# Patient Record
Sex: Female | Born: 1940 | Race: White | Hispanic: No | Smoking: Former smoker
Health system: Southern US, Community
[De-identification: ages and names within clinical notes are randomized; demographics above are authoritative.]

## PROBLEM LIST (undated history)

## (undated) DIAGNOSIS — I10 Essential (primary) hypertension: Secondary | ICD-10-CM

## (undated) DIAGNOSIS — K565 Intestinal adhesions [bands], unspecified as to partial versus complete obstruction: Secondary | ICD-10-CM

## (undated) HISTORY — PX: BOWEL RESECTION: SHX1257

## (undated) HISTORY — DX: Intestinal adhesions (bands), unspecified as to partial versus complete obstruction: K56.50

## (undated) HISTORY — PX: NASAL SEPTUM SURGERY: SHX37

## (undated) HISTORY — DX: Essential (primary) hypertension: I10

---

## 1967-07-23 HISTORY — PX: TUBAL LIGATION: SHX77

## 2014-09-09 DIAGNOSIS — Z1231 Encounter for screening mammogram for malignant neoplasm of breast: Secondary | ICD-10-CM | POA: Diagnosis not present

## 2014-10-03 DIAGNOSIS — N189 Chronic kidney disease, unspecified: Secondary | ICD-10-CM | POA: Diagnosis not present

## 2014-10-03 DIAGNOSIS — H9190 Unspecified hearing loss, unspecified ear: Secondary | ICD-10-CM | POA: Diagnosis not present

## 2014-10-03 DIAGNOSIS — R4189 Other symptoms and signs involving cognitive functions and awareness: Secondary | ICD-10-CM | POA: Diagnosis not present

## 2014-10-03 DIAGNOSIS — L309 Dermatitis, unspecified: Secondary | ICD-10-CM | POA: Diagnosis not present

## 2014-10-03 DIAGNOSIS — Z23 Encounter for immunization: Secondary | ICD-10-CM | POA: Diagnosis not present

## 2014-10-03 DIAGNOSIS — J302 Other seasonal allergic rhinitis: Secondary | ICD-10-CM | POA: Diagnosis not present

## 2014-10-03 DIAGNOSIS — M8580 Other specified disorders of bone density and structure, unspecified site: Secondary | ICD-10-CM | POA: Diagnosis not present

## 2014-10-03 DIAGNOSIS — I10 Essential (primary) hypertension: Secondary | ICD-10-CM | POA: Diagnosis not present

## 2014-10-03 DIAGNOSIS — Z139 Encounter for screening, unspecified: Secondary | ICD-10-CM | POA: Diagnosis not present

## 2015-01-28 DIAGNOSIS — J309 Allergic rhinitis, unspecified: Secondary | ICD-10-CM | POA: Diagnosis not present

## 2015-01-28 DIAGNOSIS — K5790 Diverticulosis of intestine, part unspecified, without perforation or abscess without bleeding: Secondary | ICD-10-CM | POA: Diagnosis not present

## 2015-01-28 DIAGNOSIS — K565 Intestinal adhesions [bands] with obstruction (postprocedural) (postinfection): Secondary | ICD-10-CM | POA: Diagnosis not present

## 2015-01-28 DIAGNOSIS — Z91040 Latex allergy status: Secondary | ICD-10-CM | POA: Diagnosis not present

## 2015-01-28 DIAGNOSIS — Z808 Family history of malignant neoplasm of other organs or systems: Secondary | ICD-10-CM | POA: Diagnosis not present

## 2015-01-28 DIAGNOSIS — K5669 Other intestinal obstruction: Secondary | ICD-10-CM | POA: Diagnosis not present

## 2015-01-28 DIAGNOSIS — H9190 Unspecified hearing loss, unspecified ear: Secondary | ICD-10-CM | POA: Diagnosis not present

## 2015-01-28 DIAGNOSIS — Z888 Allergy status to other drugs, medicaments and biological substances status: Secondary | ICD-10-CM | POA: Diagnosis not present

## 2015-01-28 DIAGNOSIS — K5660 Unspecified intestinal obstruction: Secondary | ICD-10-CM | POA: Diagnosis not present

## 2015-01-28 DIAGNOSIS — M8580 Other specified disorders of bone density and structure, unspecified site: Secondary | ICD-10-CM | POA: Diagnosis not present

## 2015-01-28 DIAGNOSIS — E860 Dehydration: Secondary | ICD-10-CM | POA: Diagnosis not present

## 2015-01-28 DIAGNOSIS — Z4682 Encounter for fitting and adjustment of non-vascular catheter: Secondary | ICD-10-CM | POA: Diagnosis not present

## 2015-01-28 DIAGNOSIS — F419 Anxiety disorder, unspecified: Secondary | ICD-10-CM | POA: Diagnosis not present

## 2015-01-28 DIAGNOSIS — Z885 Allergy status to narcotic agent status: Secondary | ICD-10-CM | POA: Diagnosis not present

## 2015-01-28 DIAGNOSIS — Z8249 Family history of ischemic heart disease and other diseases of the circulatory system: Secondary | ICD-10-CM | POA: Diagnosis not present

## 2015-01-28 DIAGNOSIS — I10 Essential (primary) hypertension: Secondary | ICD-10-CM | POA: Diagnosis not present

## 2015-01-28 DIAGNOSIS — Z87891 Personal history of nicotine dependence: Secondary | ICD-10-CM | POA: Diagnosis not present

## 2015-01-28 DIAGNOSIS — N181 Chronic kidney disease, stage 1: Secondary | ICD-10-CM | POA: Diagnosis not present

## 2015-01-28 DIAGNOSIS — Z8041 Family history of malignant neoplasm of ovary: Secondary | ICD-10-CM | POA: Diagnosis not present

## 2015-01-28 DIAGNOSIS — E872 Acidosis: Secondary | ICD-10-CM | POA: Diagnosis not present

## 2015-01-28 DIAGNOSIS — K660 Peritoneal adhesions (postprocedural) (postinfection): Secondary | ICD-10-CM | POA: Diagnosis not present

## 2015-01-28 DIAGNOSIS — G4700 Insomnia, unspecified: Secondary | ICD-10-CM | POA: Diagnosis not present

## 2015-01-28 DIAGNOSIS — I129 Hypertensive chronic kidney disease with stage 1 through stage 4 chronic kidney disease, or unspecified chronic kidney disease: Secondary | ICD-10-CM | POA: Diagnosis not present

## 2015-01-28 DIAGNOSIS — F329 Major depressive disorder, single episode, unspecified: Secondary | ICD-10-CM | POA: Diagnosis not present

## 2015-02-05 DIAGNOSIS — N181 Chronic kidney disease, stage 1: Secondary | ICD-10-CM | POA: Diagnosis not present

## 2015-02-05 DIAGNOSIS — I129 Hypertensive chronic kidney disease with stage 1 through stage 4 chronic kidney disease, or unspecified chronic kidney disease: Secondary | ICD-10-CM | POA: Diagnosis not present

## 2015-02-05 DIAGNOSIS — K5669 Other intestinal obstruction: Secondary | ICD-10-CM | POA: Diagnosis not present

## 2015-02-05 DIAGNOSIS — K565 Intestinal adhesions [bands] with obstruction (postprocedural) (postinfection): Secondary | ICD-10-CM | POA: Diagnosis not present

## 2015-02-08 DIAGNOSIS — N181 Chronic kidney disease, stage 1: Secondary | ICD-10-CM | POA: Diagnosis not present

## 2015-02-08 DIAGNOSIS — K5669 Other intestinal obstruction: Secondary | ICD-10-CM | POA: Diagnosis not present

## 2015-02-08 DIAGNOSIS — I129 Hypertensive chronic kidney disease with stage 1 through stage 4 chronic kidney disease, or unspecified chronic kidney disease: Secondary | ICD-10-CM | POA: Diagnosis not present

## 2015-02-08 DIAGNOSIS — K565 Intestinal adhesions [bands] with obstruction (postprocedural) (postinfection): Secondary | ICD-10-CM | POA: Diagnosis not present

## 2015-02-10 DIAGNOSIS — K565 Intestinal adhesions [bands] with obstruction (postprocedural) (postinfection): Secondary | ICD-10-CM | POA: Diagnosis not present

## 2015-02-10 DIAGNOSIS — N181 Chronic kidney disease, stage 1: Secondary | ICD-10-CM | POA: Diagnosis not present

## 2015-02-10 DIAGNOSIS — I129 Hypertensive chronic kidney disease with stage 1 through stage 4 chronic kidney disease, or unspecified chronic kidney disease: Secondary | ICD-10-CM | POA: Diagnosis not present

## 2015-02-10 DIAGNOSIS — K5669 Other intestinal obstruction: Secondary | ICD-10-CM | POA: Diagnosis not present

## 2015-02-14 DIAGNOSIS — K565 Intestinal adhesions [bands] with obstruction (postprocedural) (postinfection): Secondary | ICD-10-CM | POA: Diagnosis not present

## 2015-02-14 DIAGNOSIS — I129 Hypertensive chronic kidney disease with stage 1 through stage 4 chronic kidney disease, or unspecified chronic kidney disease: Secondary | ICD-10-CM | POA: Diagnosis not present

## 2015-02-14 DIAGNOSIS — N181 Chronic kidney disease, stage 1: Secondary | ICD-10-CM | POA: Diagnosis not present

## 2015-02-14 DIAGNOSIS — K5669 Other intestinal obstruction: Secondary | ICD-10-CM | POA: Diagnosis not present

## 2015-02-21 DIAGNOSIS — I129 Hypertensive chronic kidney disease with stage 1 through stage 4 chronic kidney disease, or unspecified chronic kidney disease: Secondary | ICD-10-CM | POA: Diagnosis not present

## 2015-02-21 DIAGNOSIS — N181 Chronic kidney disease, stage 1: Secondary | ICD-10-CM | POA: Diagnosis not present

## 2015-02-21 DIAGNOSIS — K5669 Other intestinal obstruction: Secondary | ICD-10-CM | POA: Diagnosis not present

## 2015-02-21 DIAGNOSIS — K565 Intestinal adhesions [bands] with obstruction (postprocedural) (postinfection): Secondary | ICD-10-CM | POA: Diagnosis not present

## 2015-04-01 DIAGNOSIS — K122 Cellulitis and abscess of mouth: Secondary | ICD-10-CM | POA: Diagnosis not present

## 2015-04-01 DIAGNOSIS — Z8719 Personal history of other diseases of the digestive system: Secondary | ICD-10-CM | POA: Diagnosis not present

## 2015-04-01 DIAGNOSIS — R634 Abnormal weight loss: Secondary | ICD-10-CM | POA: Diagnosis not present

## 2015-04-01 DIAGNOSIS — I10 Essential (primary) hypertension: Secondary | ICD-10-CM | POA: Diagnosis not present

## 2015-04-01 DIAGNOSIS — K565 Intestinal adhesions [bands] with obstruction (postprocedural) (postinfection): Secondary | ICD-10-CM | POA: Diagnosis not present

## 2015-04-28 DIAGNOSIS — L0390 Cellulitis, unspecified: Secondary | ICD-10-CM | POA: Diagnosis not present

## 2015-04-28 DIAGNOSIS — Z719 Counseling, unspecified: Secondary | ICD-10-CM | POA: Diagnosis not present

## 2015-06-09 DIAGNOSIS — K122 Cellulitis and abscess of mouth: Secondary | ICD-10-CM | POA: Diagnosis not present

## 2015-06-09 DIAGNOSIS — D485 Neoplasm of uncertain behavior of skin: Secondary | ICD-10-CM | POA: Diagnosis not present

## 2015-06-09 DIAGNOSIS — Z719 Counseling, unspecified: Secondary | ICD-10-CM | POA: Diagnosis not present

## 2015-12-10 DIAGNOSIS — R109 Unspecified abdominal pain: Secondary | ICD-10-CM | POA: Diagnosis not present

## 2015-12-10 DIAGNOSIS — K5660 Unspecified intestinal obstruction: Secondary | ICD-10-CM | POA: Diagnosis not present

## 2015-12-10 DIAGNOSIS — N189 Chronic kidney disease, unspecified: Secondary | ICD-10-CM | POA: Diagnosis not present

## 2015-12-10 DIAGNOSIS — Z8249 Family history of ischemic heart disease and other diseases of the circulatory system: Secondary | ICD-10-CM | POA: Diagnosis not present

## 2015-12-10 DIAGNOSIS — Z808 Family history of malignant neoplasm of other organs or systems: Secondary | ICD-10-CM | POA: Diagnosis not present

## 2015-12-10 DIAGNOSIS — I129 Hypertensive chronic kidney disease with stage 1 through stage 4 chronic kidney disease, or unspecified chronic kidney disease: Secondary | ICD-10-CM | POA: Diagnosis not present

## 2015-12-10 DIAGNOSIS — R51 Headache: Secondary | ICD-10-CM | POA: Diagnosis not present

## 2015-12-10 DIAGNOSIS — M8580 Other specified disorders of bone density and structure, unspecified site: Secondary | ICD-10-CM | POA: Diagnosis not present

## 2016-04-06 DIAGNOSIS — S3991XA Unspecified injury of abdomen, initial encounter: Secondary | ICD-10-CM | POA: Diagnosis not present

## 2016-04-06 DIAGNOSIS — S20211A Contusion of right front wall of thorax, initial encounter: Secondary | ICD-10-CM | POA: Diagnosis not present

## 2016-04-06 DIAGNOSIS — R918 Other nonspecific abnormal finding of lung field: Secondary | ICD-10-CM | POA: Diagnosis not present

## 2016-04-06 DIAGNOSIS — S0990XA Unspecified injury of head, initial encounter: Secondary | ICD-10-CM | POA: Diagnosis not present

## 2016-04-06 DIAGNOSIS — S0101XA Laceration without foreign body of scalp, initial encounter: Secondary | ICD-10-CM | POA: Diagnosis not present

## 2016-04-06 DIAGNOSIS — F419 Anxiety disorder, unspecified: Secondary | ICD-10-CM | POA: Diagnosis not present

## 2016-04-06 DIAGNOSIS — S90512A Abrasion, left ankle, initial encounter: Secondary | ICD-10-CM | POA: Diagnosis not present

## 2016-04-06 DIAGNOSIS — S99912A Unspecified injury of left ankle, initial encounter: Secondary | ICD-10-CM | POA: Diagnosis not present

## 2016-04-06 DIAGNOSIS — I10 Essential (primary) hypertension: Secondary | ICD-10-CM | POA: Diagnosis not present

## 2016-04-06 DIAGNOSIS — S3993XA Unspecified injury of pelvis, initial encounter: Secondary | ICD-10-CM | POA: Diagnosis not present

## 2016-08-23 ENCOUNTER — Encounter: Payer: Self-pay | Admitting: Internal Medicine

## 2016-08-23 ENCOUNTER — Ambulatory Visit (INDEPENDENT_AMBULATORY_CARE_PROVIDER_SITE_OTHER): Payer: Medicare Other | Admitting: Internal Medicine

## 2016-08-23 VITALS — BP 152/86 | HR 63 | Temp 98.2°F | Resp 16 | Ht 62.5 in | Wt 126.0 lb

## 2016-08-23 DIAGNOSIS — E559 Vitamin D deficiency, unspecified: Secondary | ICD-10-CM

## 2016-08-23 DIAGNOSIS — R5383 Other fatigue: Secondary | ICD-10-CM

## 2016-08-23 DIAGNOSIS — Z23 Encounter for immunization: Secondary | ICD-10-CM | POA: Diagnosis not present

## 2016-08-23 DIAGNOSIS — K5909 Other constipation: Secondary | ICD-10-CM

## 2016-08-23 DIAGNOSIS — F411 Generalized anxiety disorder: Secondary | ICD-10-CM

## 2016-08-23 DIAGNOSIS — I10 Essential (primary) hypertension: Secondary | ICD-10-CM

## 2016-08-23 LAB — COMPREHENSIVE METABOLIC PANEL
ALBUMIN: 4.4 g/dL (ref 3.6–5.1)
ALT: 13 U/L (ref 6–29)
AST: 19 U/L (ref 10–35)
Alkaline Phosphatase: 67 U/L (ref 33–130)
BUN: 19 mg/dL (ref 7–25)
CALCIUM: 9.4 mg/dL (ref 8.6–10.4)
CHLORIDE: 106 mmol/L (ref 98–110)
CO2: 24 mmol/L (ref 20–31)
Creat: 1.09 mg/dL — ABNORMAL HIGH (ref 0.60–0.93)
GLUCOSE: 91 mg/dL (ref 65–99)
Potassium: 5.3 mmol/L (ref 3.5–5.3)
Sodium: 138 mmol/L (ref 135–146)
Total Bilirubin: 0.5 mg/dL (ref 0.2–1.2)
Total Protein: 7.4 g/dL (ref 6.1–8.1)

## 2016-08-23 LAB — CBC WITH DIFFERENTIAL/PLATELET
BASOS PCT: 0 %
Basophils Absolute: 0 cells/uL (ref 0–200)
EOS PCT: 0 %
Eosinophils Absolute: 0 cells/uL — ABNORMAL LOW (ref 15–500)
HEMATOCRIT: 37.6 % (ref 35.0–45.0)
HEMOGLOBIN: 12.4 g/dL (ref 11.7–15.5)
LYMPHS ABS: 2236 {cells}/uL (ref 850–3900)
Lymphocytes Relative: 26 %
MCH: 30 pg (ref 27.0–33.0)
MCHC: 33 g/dL (ref 32.0–36.0)
MCV: 91 fL (ref 80.0–100.0)
MONO ABS: 688 {cells}/uL (ref 200–950)
MPV: 9.4 fL (ref 7.5–12.5)
Monocytes Relative: 8 %
NEUTROS ABS: 5676 {cells}/uL (ref 1500–7800)
NEUTROS PCT: 66 %
Platelets: 362 10*3/uL (ref 140–400)
RBC: 4.13 MIL/uL (ref 3.80–5.10)
RDW: 13.9 % (ref 11.0–15.0)
WBC: 8.6 10*3/uL (ref 3.8–10.8)

## 2016-08-23 LAB — TSH: TSH: 1.92 m[IU]/L

## 2016-08-23 MED ORDER — LISINOPRIL-HYDROCHLOROTHIAZIDE 20-12.5 MG PO TABS: 1.0000 | ORAL_TABLET | Freq: Every day | ORAL | 0 refills | Status: DC

## 2016-08-23 MED ORDER — PAROXETINE HCL 20 MG PO TABS: 20.0000 mg | ORAL_TABLET | Freq: Every day | ORAL | 0 refills | Status: DC

## 2016-08-23 NOTE — Progress Notes (Signed)
Pre visit review using our clinic review tool, if applicable. No additional management support is needed unless otherwise documented below in the visit note. 

## 2016-08-23 NOTE — Patient Instructions (Addendum)
I will refill the lisinopril for 90 days  At  The current dose ,  But if your home readings are < 130/80,  Increase the dose to  2 tablets mg daily   I am changing your anxiety pill to Paxil because I think it will help you be more calm and worry less .  You can start the paxil tomorrow   You can take your fiber in a pill using Metamucil or Fibercon.  Take every night  At bedtime   Taking a probiotic every day may help control your diarrhea  .Marland Kitchen Taking an antibiotic can create an imbalance in the normal population of bacteria that live in the small intestine.  This imbalance can persist for 3 months.    The the chocolate covered  on by Schiff

## 2016-08-23 NOTE — Progress Notes (Signed)
Subjective:  Patient ID: Felicia Anderson, female    DOB: 04/21/1941  Age: 76 y.o. MRN: [id number]  CC: The primary encounter diagnosis was Vitamin D deficiency. Diagnoses of Need for influenza vaccination, Fatigue, unspecified type, Essential hypertension, Chronic constipation, and Generalized anxiety disorder were also pertinent to this visit.  HPI Felicia Anderson presents for establishment of care, referred by daughter Felicia Anderson. Patient recently relocated to [facility] from [place] and medical records are not available.   Cc: episodic diarrhea,  Alternating with constipation and diarrhea, ,  History of bowel obstructions x 3 requiring surgery   Was told to take metamucil daily  But does not remember to take it on a regular basis   History of hypertension:  Has been taking  lisinopril 5 mg daily for years.   Has been having more frequent headaches ,  Has not had her annual eye exam.   Anxiety: present for many years,  Has been taking citalopram for years  For mild depression but anxiety has been pervasive .  No prior trials of or=ther ssri's.  No outpatient prescriptions prior to visit.   No facility-administered medications prior to visit.     Review of Systems;  Patient denies headache, fevers, malaise, unintentional weight loss, skin rash, eye pain, sinus congestion and sinus pain, sore throat, dysphagia,  hemoptysis , cough, dyspnea, wheezing, chest pain, palpitations, orthopnea, edema, abdominal pain, nausea, melena, diarrhea, flank pain, dysuria, hematuria, urinary  Frequency, nocturia, numbness, tingling, seizures,  Focal weakness, Loss of consciousness,  Tremor, insomnia, depression, anxiety, and suicidal ideation.      Objective:  BP (!) 152/86   Pulse 63   Temp 98.2 F (36.8 C) (Oral)   Resp 16   Ht 5' 2.5" (1.588 m)   Wt 126 lb (57.2 kg)   SpO2 97%   BMI 22.68 kg/m   BP Readings from Last 3 Encounters:  08/23/16 (!) 152/86    Wt Readings from Last 3  Encounters:  08/23/16 126 lb (57.2 kg)    General appearance: alert, cooperative and appears stated age Ears: normal TM's and external ear canals both ears Throat: lips, mucosa, and tongue normal; teeth and gums normal Neck: no adenopathy, no carotid bruit, supple, symmetrical, trachea midline and thyroid not enlarged, symmetric, no tenderness/mass/nodules Back: symmetric, no curvature. ROM normal. No CVA tenderness. Lungs: clear to auscultation bilaterally Heart: regular rate and rhythm, S1, S2 normal, no murmur, click, rub or gallop Abdomen: soft, non-tender; bowel sounds normal; no masses,  no organomegaly Pulses: 2+ and symmetric Skin: Skin color, texture, turgor normal. No rashes or lesions Lymph nodes: Cervical, supraclavicular, and axillary nodes normal.  No results found for: HGBA1C  Lab Results  Component Value Date   CREATININE 1.09 (H) 08/23/2016    Lab Results  Component Value Date   WBC 8.6 08/23/2016   HGB 12.4 08/23/2016   HCT 37.6 08/23/2016   PLT 362 08/23/2016   GLUCOSE 91 08/23/2016   ALT 13 08/23/2016   AST 19 08/23/2016   NA 138 08/23/2016   K 5.3 08/23/2016   CL 106 08/23/2016   CREATININE 1.09 (H) 08/23/2016   BUN 19 08/23/2016   CO2 24 08/23/2016   TSH 1.92 08/23/2016    Patient was never admitted.  Assessment & Plan:   Problem List Items Addressed This Visit    Chronic constipation    With history of recurrent SBO requiring surgeries.  Advise to start using citrucel/miralax daily. Records requested  Generalized anxiety disorder    Her anxeit is not well controlled with citalopram  Dicussed change to paxil       Hypertension    Managed with lisinopril for years.  Checking lytes and cr today, both of which are abnormal .  Will repeat both in one week  and  If still elevated change to  amlodipine   Lab Results  Component Value Date   CREATININE 1.09 (H) 08/23/2016   Lab Results  Component Value Date   NA 138 08/23/2016   K 5.3  08/23/2016   CL 106 08/23/2016   CO2 24 08/23/2016   No results found for: MICROALBUR, MALB24HUR       Relevant Medications   lisinopril-hydrochlorothiazide (PRINZIDE,ZESTORETIC) 20-12.5 MG tablet    Other Visit Diagnoses    Vitamin D deficiency    -  Primary   Relevant Orders   VITAMIN D 25 Hydroxy (Vit-D Deficiency, Fractures) (Completed)   Need for influenza vaccination       Relevant Orders   Flu vaccine HIGH DOSE PF (Completed)   Fatigue, unspecified type       Relevant Orders   Comprehensive metabolic panel (Completed)   TSH (Completed)   CBC with Differential/Platelet (Completed)      I am having [name] start on PARoxetine. I am also having her maintain her citalopram, Melatonin, multivitamin, (VITAMIN D, CHOLECALCIFEROL, PO), acetaminophen, and lisinopril-hydrochlorothiazide.  Meds ordered this encounter  Medications  . citalopram (CELEXA) 20 MG tablet    Sig: Take 20 mg by mouth daily.  Marland Kitchen DISCONTD: lisinopril-hydrochlorothiazide (PRINZIDE,ZESTORETIC) 20-12.5 MG tablet    Sig: Take 1 tablet by mouth daily.  . Melatonin 5 MG TABS    Sig: Take 1 tablet by mouth at bedtime.  . Multiple Vitamin (MULTIVITAMIN) tablet    Sig: Take 1 tablet by mouth daily.  Marland Kitchen VITAMIN D, CHOLECALCIFEROL, PO    Sig: Take 1 each by mouth daily.  Marland Kitchen acetaminophen (TYLENOL) 500 MG tablet    Sig: Take 500 mg by mouth every 6 (six) hours as needed.  Marland Kitchen lisinopril-hydrochlorothiazide (PRINZIDE,ZESTORETIC) 20-12.5 MG tablet    Sig: Take 1 tablet by mouth daily.    Dispense:  90 tablet    Refill:  0  . PARoxetine (PAXIL) 20 MG tablet    Sig: Take 1 tablet (20 mg total) by mouth daily.    Dispense:  90 tablet    Refill:  0    Medications Discontinued During This Encounter  Medication Reason  . lisinopril-hydrochlorothiazide (PRINZIDE,ZESTORETIC) 20-12.5 MG tablet Reorder    Follow-up: Return in about 4 weeks (around 09/20/2016) for bp follow up .   Crecencio Mc, MD

## 2016-08-24 LAB — VITAMIN D 25 HYDROXY (VIT D DEFICIENCY, FRACTURES): Vit D, 25-Hydroxy: 58 ng/mL (ref 30–100)

## 2016-08-25 ENCOUNTER — Encounter: Payer: Self-pay | Admitting: Internal Medicine

## 2016-08-25 DIAGNOSIS — K5909 Other constipation: Secondary | ICD-10-CM | POA: Insufficient documentation

## 2016-08-25 DIAGNOSIS — F411 Generalized anxiety disorder: Secondary | ICD-10-CM | POA: Insufficient documentation

## 2016-08-25 DIAGNOSIS — I10 Essential (primary) hypertension: Secondary | ICD-10-CM | POA: Insufficient documentation

## 2016-08-25 NOTE — Assessment & Plan Note (Signed)
Managed with lisinopril for years.  Checking lytes and cr today, both of which are abnormal .  Will repeat both in one week  and  If still elevated change to  amlodipine   Lab Results  Component Value Date   CREATININE 1.09 (H) 08/23/2016   Lab Results  Component Value Date   NA 138 08/23/2016   K 5.3 08/23/2016   CL 106 08/23/2016   CO2 24 08/23/2016   No results found for: Derl Barrow

## 2016-08-25 NOTE — Assessment & Plan Note (Signed)
Her anxeit is not well controlled with citalopram  Dicussed change to paxil

## 2016-08-25 NOTE — Assessment & Plan Note (Addendum)
With history of recurrent SBO requiring surgeries.  Advise to start using citrucel/miralax daily. Records requested

## 2016-08-27 ENCOUNTER — Telehealth: Payer: Self-pay | Admitting: Internal Medicine

## 2016-08-27 DIAGNOSIS — E875 Hyperkalemia: Secondary | ICD-10-CM

## 2016-08-27 NOTE — Telephone Encounter (Signed)
Pt daughter [name] called back with the information that you requested. Pt has not been on any other blood pressure medication other than lisinopril-hydrochlorothiazide (PRINZIDE,ZESTORETIC) 20-12.5 MG tablet she has been taking it for about 4 years. She has not been told by any other doctor that her Potassium was midly level was elevated or that her Kidney function was slightly off. Please advise, thank you!  Call pt @ [phone]

## 2016-08-27 NOTE — Telephone Encounter (Signed)
The below message is based on result note below. Notes Recorded by Cresenciano Lick, CMA on 08/26/2016 at 4:02 PM EST Pt's daughter informed. She is going to double check with pt and call us back.  Notes Recorded by Crecencio Mc, MD on 08/25/2016 at 5:21 PM EST Your potassium is mildly elevated and your kidney function is slightly off. Have you ever been told this before? It may be due to the lisinopril. Have you ever been any other blood pressure medication?

## 2016-08-28 NOTE — Telephone Encounter (Signed)
While we are waiting for records  We should repeat the BMET in 1 week

## 2016-08-28 NOTE — Telephone Encounter (Signed)
Left detailed mess informing pt's daughter of PCP's advisement.

## 2016-09-05 ENCOUNTER — Other Ambulatory Visit (INDEPENDENT_AMBULATORY_CARE_PROVIDER_SITE_OTHER): Payer: Medicare Other

## 2016-09-05 DIAGNOSIS — E875 Hyperkalemia: Secondary | ICD-10-CM | POA: Diagnosis not present

## 2016-09-06 LAB — BASIC METABOLIC PANEL
BUN: 24 mg/dL — AB (ref 6–23)
CHLORIDE: 104 meq/L (ref 96–112)
CO2: 25 meq/L (ref 19–32)
Calcium: 9.3 mg/dL (ref 8.4–10.5)
Creatinine, Ser: 1.25 mg/dL — ABNORMAL HIGH (ref 0.40–1.20)
GFR: 44.5 mL/min — ABNORMAL LOW (ref >60.00)
Glucose, Bld: 107 mg/dL — ABNORMAL HIGH (ref 70–99)
POTASSIUM: 4.7 meq/L (ref 3.5–5.1)
Sodium: 138 mEq/L (ref 135–145)

## 2016-09-08 ENCOUNTER — Encounter: Payer: Self-pay | Admitting: Internal Medicine

## 2016-09-08 ENCOUNTER — Other Ambulatory Visit: Payer: Self-pay | Admitting: Internal Medicine

## 2016-09-08 DIAGNOSIS — N183 Chronic kidney disease, stage 3 unspecified: Secondary | ICD-10-CM | POA: Insufficient documentation

## 2016-09-08 MED ORDER — AMLODIPINE BESYLATE 5 MG PO TABS
5.0000 mg | ORAL_TABLET | Freq: Every day | ORAL | 0 refills | Status: DC
Start: 2016-09-08 — End: 2016-10-08

## 2016-09-16 DIAGNOSIS — S0083XA Contusion of other part of head, initial encounter: Secondary | ICD-10-CM | POA: Diagnosis not present

## 2016-09-16 DIAGNOSIS — W19XXXA Unspecified fall, initial encounter: Secondary | ICD-10-CM | POA: Diagnosis not present

## 2016-09-16 DIAGNOSIS — Z719 Counseling, unspecified: Secondary | ICD-10-CM | POA: Diagnosis not present

## 2016-09-24 ENCOUNTER — Telehealth: Payer: Self-pay | Admitting: *Deleted

## 2016-09-24 NOTE — Telephone Encounter (Signed)
App made for pt

## 2016-09-24 NOTE — Telephone Encounter (Signed)
Pt's daughter called to schedule fasting labs. Will this appt require fasting ?

## 2016-10-01 ENCOUNTER — Other Ambulatory Visit: Payer: Medicare Other

## 2016-10-08 ENCOUNTER — Other Ambulatory Visit: Payer: Self-pay | Admitting: Internal Medicine

## 2016-10-08 ENCOUNTER — Other Ambulatory Visit (INDEPENDENT_AMBULATORY_CARE_PROVIDER_SITE_OTHER): Payer: Medicare Other

## 2016-10-08 DIAGNOSIS — N183 Chronic kidney disease, stage 3 unspecified: Secondary | ICD-10-CM

## 2016-10-08 LAB — BASIC METABOLIC PANEL
BUN: 14 mg/dL (ref 6–23)
CALCIUM: 9.5 mg/dL (ref 8.4–10.5)
CHLORIDE: 104 meq/L (ref 96–112)
CO2: 28 meq/L (ref 19–32)
CREATININE: 0.89 mg/dL (ref 0.40–1.20)
GFR: 65.84 mL/min (ref >60.00)
Glucose, Bld: 89 mg/dL (ref 70–99)
Potassium: 4.2 mEq/L (ref 3.5–5.1)
Sodium: 139 mEq/L (ref 135–145)

## 2016-10-09 ENCOUNTER — Other Ambulatory Visit: Payer: Self-pay | Admitting: Internal Medicine

## 2016-10-09 DIAGNOSIS — I10 Essential (primary) hypertension: Secondary | ICD-10-CM

## 2016-10-09 DIAGNOSIS — N183 Chronic kidney disease, stage 3 unspecified: Secondary | ICD-10-CM

## 2016-10-09 DIAGNOSIS — E7800 Pure hypercholesterolemia, unspecified: Secondary | ICD-10-CM

## 2016-10-09 MED ORDER — AMLODIPINE BESYLATE 5 MG PO TABS: 5.0000 mg | ORAL_TABLET | Freq: Every day | ORAL | 0 refills | Status: DC

## 2016-10-09 NOTE — Progress Notes (Unsigned)
ipid

## 2016-11-19 ENCOUNTER — Other Ambulatory Visit: Payer: Self-pay | Admitting: Internal Medicine

## 2017-01-10 ENCOUNTER — Encounter: Payer: Self-pay | Admitting: Internal Medicine

## 2017-01-10 ENCOUNTER — Ambulatory Visit (INDEPENDENT_AMBULATORY_CARE_PROVIDER_SITE_OTHER): Payer: Medicare Other | Admitting: Internal Medicine

## 2017-01-10 VITALS — BP 132/70 | HR 63 | Temp 97.8°F | Resp 17 | Ht 62.5 in | Wt 137.2 lb

## 2017-01-10 DIAGNOSIS — F411 Generalized anxiety disorder: Secondary | ICD-10-CM

## 2017-01-10 DIAGNOSIS — E7800 Pure hypercholesterolemia, unspecified: Secondary | ICD-10-CM | POA: Diagnosis not present

## 2017-01-10 DIAGNOSIS — Z720 Tobacco use: Secondary | ICD-10-CM

## 2017-01-10 DIAGNOSIS — N183 Chronic kidney disease, stage 3 unspecified: Secondary | ICD-10-CM

## 2017-01-10 DIAGNOSIS — R41840 Attention and concentration deficit: Secondary | ICD-10-CM | POA: Diagnosis not present

## 2017-01-10 DIAGNOSIS — K5909 Other constipation: Secondary | ICD-10-CM | POA: Diagnosis not present

## 2017-01-10 DIAGNOSIS — E538 Deficiency of other specified B group vitamins: Secondary | ICD-10-CM

## 2017-01-10 DIAGNOSIS — I10 Essential (primary) hypertension: Secondary | ICD-10-CM | POA: Diagnosis not present

## 2017-01-10 LAB — LIPID PANEL
CHOL/HDL RATIO: 2
Cholesterol: 134 mg/dL (ref 0–200)
HDL: 54.1 mg/dL (ref >39.00)
LDL CALC: 63 mg/dL (ref 0–99)
NonHDL: 79.97
TRIGLYCERIDES: 87 mg/dL (ref 0.0–149.0)
VLDL: 17.4 mg/dL (ref 0.0–40.0)

## 2017-01-10 LAB — COMPREHENSIVE METABOLIC PANEL
ALT: 13 U/L (ref 0–35)
AST: 18 U/L (ref 0–37)
Albumin: 4.2 g/dL (ref 3.5–5.2)
Alkaline Phosphatase: 68 U/L (ref 39–117)
BILIRUBIN TOTAL: 0.6 mg/dL (ref 0.2–1.2)
BUN: 20 mg/dL (ref 6–23)
CHLORIDE: 105 meq/L (ref 96–112)
CO2: 27 meq/L (ref 19–32)
CREATININE: 1.01 mg/dL (ref 0.40–1.20)
Calcium: 9.3 mg/dL (ref 8.4–10.5)
GFR: 56.86 mL/min — ABNORMAL LOW (ref >60.00)
GLUCOSE: 87 mg/dL (ref 70–99)
Potassium: 4.5 mEq/L (ref 3.5–5.1)
SODIUM: 141 meq/L (ref 135–145)
Total Protein: 6.9 g/dL (ref 6.0–8.3)

## 2017-01-10 LAB — VITAMIN B12: Vitamin B-12: 608 pg/mL (ref 211–911)

## 2017-01-10 NOTE — Patient Instructions (Addendum)
For your constipation and diarrhea issues:  Continue taking probiotic and generic citrucel every night. . Add 100 to  200 mg colace (stool softener) suspend colace only,  For loose stools.  For your anxiety:  We will change your antidepressant from paxil to generic zoloft, starting witt 50 mg daily .  Your blood pressure is much better on the amlodipine   For your concentration issues:  I am referring you to Dr Lurline Hare for memory testing

## 2017-01-10 NOTE — Progress Notes (Signed)
Subjective:  Patient ID: Felicia Anderson, female    DOB: February 25, 1941  Age: 76 y.o. MRN: [id number]  CC: The primary encounter diagnosis was Cognitive attention deficit. Diagnoses of B12 deficiency, Pure hypercholesterolemia, Essential hypertension, Concentration deficit, Chronic constipation, Generalized anxiety disorder, and CKD (chronic kidney disease) stage 3, GFR 30-59 ml/min were also pertinent to this visit.  HPI Felicia Anderson presents for follow up on hypertension and GAD   Medication changes at  Her previous/initial visit: citaloprram  Was changed to  paxil  2) Hypertension :  At her last visit medication Lisinopril changed to to amlodipine   3) chronic constipation:  She has been taking probiotic and fiber supplement for the  last several months.  Still having periods of constipation lasting  4 to 5 days, not taking a stool softener  4) GAD worse: "worries about everything" , daughter says she obsesses,  And feels she has ADHD because she does not have good concentration and doesn't remember details.  Patient is Upset about giving up her house and independence in [place] and moving in with Felicia Anderson her daughter for the past 8 months .  The original plan was to live with all 3 children in a revolving schedule.but that hasn't happened and she feels like she is a burden to her children . Feels her daughter Felicia Anderson gets frustrated with her .  Feels isolated, has no friends. Not inclined to seek out freiends,  Introverted.     Takes B12 supplements   Discussed referral to Felicia Anderson   bp better  Outpatient Medications Prior to Visit  Medication Sig Dispense Refill  . acetaminophen (TYLENOL) 500 MG tablet Take 500 mg by mouth every 6 (six) hours as needed.    Marland Kitchen amLODipine (NORVASC) 5 MG tablet Take 1 tablet (5 mg total) by mouth daily. 90 tablet 0  . Melatonin 5 MG TABS Take 1 tablet by mouth at bedtime.    . Multiple Vitamin (MULTIVITAMIN) tablet Take 1 tablet by mouth daily.    Marland Kitchen PARoxetine  (PAXIL) 20 MG tablet TAKE 1 TABLET (20 MG TOTAL) BY MOUTH DAILY. 90 tablet 0  . VITAMIN D, CHOLECALCIFEROL, PO Take 1 each by mouth daily.    . citalopram (CELEXA) 20 MG tablet Take 20 mg by mouth daily.    Marland Kitchen lisinopril-hydrochlorothiazide (PRINZIDE,ZESTORETIC) 20-12.5 MG tablet TAKE 1 TABLET BY MOUTH DAILY. (Patient not taking: Reported on 01/10/2017) 90 tablet 0   No facility-administered medications prior to visit.     Review of Systems;  Patient denies headache, fevers, malaise, unintentional weight loss, skin rash, eye pain, sinus congestion and sinus pain, sore throat, dysphagia,  hemoptysis , cough, dyspnea, wheezing, chest pain, palpitations, orthopnea, edema, abdominal pain, nausea, melena, diarrhea, constipation, flank pain, dysuria, hematuria, urinary  Frequency, nocturia, numbness, tingling, seizures,  Focal weakness, Loss of consciousness,  Tremor, insomnia, depression, anxiety, and suicidal ideation.      Objective:  BP 132/70 (BP Location: Left Arm, Patient Position: Sitting, Cuff Size: Normal)   Pulse 63   Temp 97.8 F (36.6 C) (Oral)   Resp 17   Ht 5' 2.5" (1.588 m)   Wt 137 lb 3.2 oz (62.2 kg)   SpO2 99%   BMI 24.69 kg/m   BP Readings from Last 3 Encounters:  01/10/17 132/70  08/23/16 (!) 152/86    Wt Readings from Last 3 Encounters:  01/10/17 137 lb 3.2 oz (62.2 kg)  08/23/16 126 lb (57.2 kg)    General appearance: alert, cooperative and  appears stated age Ears: normal TM's and external ear canals both ears Throat: lips, mucosa, and tongue normal; teeth and gums normal Neck: no adenopathy, no carotid bruit, supple, symmetrical, trachea midline and thyroid not enlarged, symmetric, no tenderness/mass/nodules Back: symmetric, no curvature. ROM normal. No CVA tenderness. Lungs: clear to auscultation bilaterally Heart: regular rate and rhythm, S1, S2 normal, no murmur, click, rub or gallop Abdomen: soft, non-tender; bowel sounds normal; no masses,  no  organomegaly Pulses: 2+ and symmetric Skin: Skin color, texture, turgor normal. No rashes or lesions Lymph nodes: Cervical, supraclavicular, and axillary nodes normal.  No results found for: HGBA1C  Lab Results  Component Value Date   CREATININE 1.01 01/10/2017   CREATININE 0.89 10/08/2016   CREATININE 1.25 (H) 09/05/2016    Lab Results  Component Value Date   WBC 8.6 08/23/2016   HGB 12.4 08/23/2016   HCT 37.6 08/23/2016   PLT 362 08/23/2016   GLUCOSE 87 01/10/2017   CHOL 134 01/10/2017   TRIG 87.0 01/10/2017   HDL 54.10 01/10/2017   LDLCALC 63 01/10/2017   ALT 13 01/10/2017   AST 18 01/10/2017   NA 141 01/10/2017   K 4.5 01/10/2017   CL 105 01/10/2017   CREATININE 1.01 01/10/2017   BUN 20 01/10/2017   CO2 27 01/10/2017   TSH 1.92 08/23/2016    Patient was never admitted.  Assessment & Plan:   Problem List Items Addressed This Visit    Hypertension   Relevant Orders   Comprehensive metabolic panel (Completed)   Generalized anxiety disorder    Changing paxil to zoloft for OCD thought patterns       Concentration deficit    Unclear if her symptoms are due to untreated anxiety bordering on obsessiveness,  Or whether ADD is playimg a role. Referral to Dr Felicia Anderson for neurocognitive testing       CKD (chronic kidney disease) stage 3, GFR 30-59 ml/min    Resolved with  D/c lisinopril  Lab Results  Component Value Date   CREATININE 1.01 01/10/2017   CREATININE 0.89 10/08/2016   CREATININE 1.25 (H) 09/05/2016        Chronic constipation    Advised to take colace, citrucel and probiotic daily.       Other Visit Diagnoses    Cognitive attention deficit    -  Primary   Relevant Orders   Ambulatory referral to Psychology   B12 deficiency       Relevant Orders   Vitamin B12 (Completed)   Pure hypercholesterolemia       Relevant Orders   Lipid panel (Completed)      I have discontinued Felicia Anderson's citalopram and lisinopril-hydrochlorothiazide.  I am also having her maintain her Melatonin, multivitamin, (VITAMIN D, CHOLECALCIFEROL, PO), acetaminophen, amLODipine, and PARoxetine.  No orders of the defined types were placed in this encounter.   Medications Discontinued During This Encounter  Medication Reason  . citalopram (CELEXA) 20 MG tablet Patient has not taken in last 30 days  . lisinopril-hydrochlorothiazide (PRINZIDE,ZESTORETIC) 20-12.5 MG tablet Patient has not taken in last 30 days    Follow-up: Return in about 4 weeks (around 02/07/2017).   Crecencio Mc, MD

## 2017-01-12 DIAGNOSIS — F0280 Dementia in other diseases classified elsewhere without behavioral disturbance: Secondary | ICD-10-CM | POA: Insufficient documentation

## 2017-01-12 DIAGNOSIS — G309 Alzheimer's disease, unspecified: Secondary | ICD-10-CM

## 2017-01-12 DIAGNOSIS — F0281 Dementia in other diseases classified elsewhere with behavioral disturbance: Secondary | ICD-10-CM | POA: Insufficient documentation

## 2017-01-12 NOTE — Assessment & Plan Note (Signed)
Unclear if her symptoms are due to untreated anxiety bordering on obsessiveness,  Or whether ADD is playimg a role. Referral to Dr Rainey Pines for neurocognitive testing

## 2017-01-12 NOTE — Assessment & Plan Note (Addendum)
Resolved with  D/c lisinopril  Lab Results  Component Value Date   CREATININE 1.01 01/10/2017   CREATININE 0.89 10/08/2016   CREATININE 1.25 (H) 09/05/2016

## 2017-01-12 NOTE — Assessment & Plan Note (Signed)
Changing paxil to zoloft for OCD thought patterns

## 2017-01-12 NOTE — Assessment & Plan Note (Signed)
Advised to take colace, citrucel and probiotic daily.

## 2017-01-29 DIAGNOSIS — S0011XA Contusion of right eyelid and periocular area, initial encounter: Secondary | ICD-10-CM | POA: Diagnosis not present

## 2017-01-29 DIAGNOSIS — W01190A Fall on same level from slipping, tripping and stumbling with subsequent striking against furniture, initial encounter: Secondary | ICD-10-CM | POA: Diagnosis not present

## 2017-02-06 ENCOUNTER — Other Ambulatory Visit: Payer: Self-pay | Admitting: Internal Medicine

## 2017-02-06 DIAGNOSIS — N183 Chronic kidney disease, stage 3 unspecified: Secondary | ICD-10-CM

## 2017-02-12 ENCOUNTER — Encounter: Payer: Self-pay | Admitting: Internal Medicine

## 2017-02-12 ENCOUNTER — Ambulatory Visit (INDEPENDENT_AMBULATORY_CARE_PROVIDER_SITE_OTHER): Payer: Medicare Other | Admitting: Internal Medicine

## 2017-02-12 VITALS — BP 134/78 | HR 64 | Temp 98.0°F | Resp 15 | Ht 62.5 in | Wt 136.4 lb

## 2017-02-12 DIAGNOSIS — R41840 Attention and concentration deficit: Secondary | ICD-10-CM | POA: Diagnosis not present

## 2017-02-12 DIAGNOSIS — N183 Chronic kidney disease, stage 3 unspecified: Secondary | ICD-10-CM

## 2017-02-12 DIAGNOSIS — Z1231 Encounter for screening mammogram for malignant neoplasm of breast: Secondary | ICD-10-CM

## 2017-02-12 DIAGNOSIS — I10 Essential (primary) hypertension: Secondary | ICD-10-CM

## 2017-02-12 DIAGNOSIS — Z1239 Encounter for other screening for malignant neoplasm of breast: Secondary | ICD-10-CM

## 2017-02-12 DIAGNOSIS — F411 Generalized anxiety disorder: Secondary | ICD-10-CM

## 2017-02-12 MED ORDER — SERTRALINE HCL 100 MG PO TABS
100.0000 mg | ORAL_TABLET | Freq: Every day | ORAL | 3 refills | Status: DC
Start: 2017-02-12 — End: 2017-06-15

## 2017-02-12 NOTE — Patient Instructions (Addendum)
When you switch from paxil to zoloft  And take the zoloft  at night after dinner on a full stomach . You can start with a 1/2 tablet for the first few days,    You do not have dementia!    I have scheduled your mammogram

## 2017-02-12 NOTE — Progress Notes (Signed)
Subjective:  Patient ID: Felicia Anderson, female    DOB: March 23, 1941  Age: 76 y.o. MRN: [id number]  CC: The primary encounter diagnosis was Breast cancer screening. Diagnoses of Generalized anxiety disorder, Concentration deficit, CKD (chronic kidney disease) stage 3, GFR 30-59 ml/min, and Essential hypertension were also pertinent to this visit.  HPI Felicia Anderson presents for follow up o cognitive changes and uncontrolled anxiety   She was last seen in mid June and medication change form paxil to zoloft was advsed but she  Has not made the change.  She continues to report symptoms otms of poor concentration,  Forgetfulness, and isolation complicated by deceased hearing and a feeling that she is a burden to her children, especially her daughter Felicia Anderson, who is with her today.  She is alone all day long and  has not made any attempt to meet people her own age , so when Felicia Anderson comes home from work, she is eager for company and conversation; unfortunately, Felicia Anderson is often tired and just wants peace and quiet.   Health maintenance issues reviewed:  Last colonoscopy done in [place],  [place]. She has a history of partial  bowel resection, due to recurrent bowel obstruction  remotely   Last DEXA also done [facility]   MMSE was done today with considerable difficulty due to hearing loss      Outpatient Medications Prior to Visit  Medication Sig Dispense Refill  . acetaminophen (TYLENOL) 500 MG tablet Take 500 mg by mouth every 6 (six) hours as needed.    Marland Kitchen amLODipine (NORVASC) 5 MG tablet TAKE 1 TABLET (5 MG TOTAL) BY MOUTH DAILY. 90 tablet 1  . Melatonin 5 MG TABS Take 1 tablet by mouth at bedtime.    . Multiple Vitamin (MULTIVITAMIN) tablet Take 1 tablet by mouth daily.    Marland Kitchen PARoxetine (PAXIL) 20 MG tablet TAKE 1 TABLET (20 MG TOTAL) BY MOUTH DAILY. 90 tablet 0  . VITAMIN D, CHOLECALCIFEROL, PO Take 1 each by mouth daily.     No facility-administered medications prior to visit.      Review of Systems;  Patient denies headache, fevers, malaise, unintentional weight loss, skin rash, eye pain, sinus congestion and sinus pain, sore throat, dysphagia,  hemoptysis , cough, dyspnea, wheezing, chest pain, palpitations, orthopnea, edema, abdominal pain, nausea, melena, diarrhea, constipation, flank pain, dysuria, hematuria, urinary  Frequency, nocturia, numbness, tingling, seizures,  Focal weakness, Loss of consciousness,  Tremor, insomnia, and suicidal ideation.      Objective:  BP 134/78 (BP Location: Left Arm, Patient Position: Sitting, Cuff Size: Normal)   Pulse 64   Temp 98 F (36.7 C) (Oral)   Resp 15   Ht 5' 2.5" (1.588 m)   Wt 136 lb 6.4 oz (61.9 kg)   SpO2 99%   BMI 24.55 kg/m   BP Readings from Last 3 Encounters:  02/12/17 134/78  01/10/17 132/70  08/23/16 (!) 152/86    Wt Readings from Last 3 Encounters:  02/12/17 136 lb 6.4 oz (61.9 kg)  01/10/17 137 lb 3.2 oz (62.2 kg)  08/23/16 126 lb (57.2 kg)    General appearance: alert, anxious, hard of hearing, but cooperative and appears stated age Ears: normal TM's and external ear canals both ears Throat: lips, mucosa, and tongue normal; teeth and gums normal Neck: no adenopathy, no carotid bruit, supple, symmetrical, trachea midline and thyroid not enlarged, symmetric, no tenderness/mass/nodules Back: symmetric, no curvature. ROM normal. No CVA tenderness. Lungs: clear to auscultation bilaterally Heart: regular  rate and rhythm, S1, S2 normal, no murmur, click, rub or gallop Abdomen: soft, non-tender; bowel sounds normal; no masses,  no organomegaly Pulses: 2+ and symmetric Skin: Skin color, texture, turgor normal. No rashes or lesions Lymph nodes: Cervical, supraclavicular, and axillary nodes normal. MMSE: 27/30 unable to calculate serial 7s or spell world backword  No results found for: HGBA1C  Lab Results  Component Value Date   CREATININE 1.01 01/10/2017   CREATININE 0.89 10/08/2016    CREATININE 1.25 (H) 09/05/2016    Lab Results  Component Value Date   WBC 8.6 08/23/2016   HGB 12.4 08/23/2016   HCT 37.6 08/23/2016   PLT 362 08/23/2016   GLUCOSE 87 01/10/2017   CHOL 134 01/10/2017   TRIG 87.0 01/10/2017   HDL 54.10 01/10/2017   LDLCALC 63 01/10/2017   ALT 13 01/10/2017   AST 18 01/10/2017   NA 141 01/10/2017   K 4.5 01/10/2017   CL 105 01/10/2017   CREATININE 1.01 01/10/2017   BUN 20 01/10/2017   CO2 27 01/10/2017   TSH 1.92 08/23/2016    Patient was never admitted.  Assessment & Plan:   Problem List Items Addressed This Visit    CKD (chronic kidney disease) stage 3, GFR 30-59 ml/min    Resolved change in BP medication from lisinopril  To amlodipine .  No changes today .   Lab Results  Component Value Date   CREATININE 1.01 01/10/2017   CREATININE 0.89 10/08/2016   CREATININE 1.25 (H) 09/05/2016        Concentration deficit    She does not appear to have dementia, but  Her concentration is low and complicated by hearing loss.  Referral for cognitive testing is in progress to [facility]    Recommend change from paroxetine 20 mg to zoloft 100 mg.  [facility] her [facility]      Hypertension    Well controlled on current regimen of amlodipine 5 mg daily.   Lisinopril stopped with return of Cr to normal.  no changes today.       Other Visit Diagnoses    Breast cancer screening    -  Primary   Relevant Orders   MM SCREENING BREAST TOMO BILATERAL     A total of 25 minutes of face to face time was spent with patient more than half of which was spent in counselling about the above mentioned conditions  and coordination of care    I have discontinued Felicia Anderson's (VITAMIN D, CHOLECALCIFEROL, PO) and PARoxetine. I am also having her start on sertraline. Additionally, I am having her maintain her Melatonin, multivitamin, acetaminophen, amLODipine,  Vitamin D3, and B Complex Vitamins (VITAMIN B COMPLEX PO).  Meds ordered this encounter  Medications  . Cholecalciferol (VITAMIN D3) 100000 UNIT/GM POWD    Sig: Take by mouth.  . B Complex Vitamins (VITAMIN B COMPLEX PO)    Sig: Take by mouth.  . sertraline (ZOLOFT) 100 MG tablet    Sig: Take 1 tablet (100 mg total) by mouth daily.    Dispense:  30 tablet    Refill:  3    Medications Discontinued During This Encounter  Medication Reason  . VITAMIN D, CHOLECALCIFEROL, PO Patient has not taken in last 30 days  . PARoxetine (PAXIL) 20 MG tablet     Follow-up: Return in about 3 months (around 05/15/2017) for CPE,  sooner if wanted .   Crecencio Mc, MD

## 2017-02-15 ENCOUNTER — Telehealth: Payer: Self-pay | Admitting: Internal Medicine

## 2017-02-15 NOTE — Assessment & Plan Note (Signed)
She does not appear to have dementia, but  Her concentration is low and complicated by hearing loss.  Referral for cognitive testing is in progress to [facility]

## 2017-02-15 NOTE — Assessment & Plan Note (Signed)
Recommend change from paroxetine 20 mg to zoloft 100 mg.  Encouraged [facility] her [facility]

## 2017-02-15 NOTE — Assessment & Plan Note (Signed)
Well controlled on current regimen of amlodipine 5 mg daily.   Lisinopril stopped with return of Cr to normal.  no changes today.

## 2017-02-15 NOTE — Assessment & Plan Note (Signed)
Resolved change in BP medication from lisinopril  To amlodipine .  No changes today .   Lab Results  Component Value Date   CREATININE 1.01 01/10/2017   CREATININE 0.89 10/08/2016   CREATININE 1.25 (H) 09/05/2016

## 2017-03-12 ENCOUNTER — Encounter: Payer: Self-pay | Admitting: Internal Medicine

## 2017-04-04 ENCOUNTER — Encounter: Payer: Self-pay | Admitting: Emergency Medicine

## 2017-04-04 ENCOUNTER — Emergency Department
Admission: EM | Admit: 2017-04-04 | Discharge: 2017-04-04 | Disposition: A | Discharge status: Home / Self Care | Payer: Medicare Other | Attending: Emergency Medicine | Admitting: Emergency Medicine

## 2017-04-04 ENCOUNTER — Emergency Department: Payer: Medicare Other

## 2017-04-04 DIAGNOSIS — N183 Chronic kidney disease, stage 3 (moderate): Secondary | ICD-10-CM | POA: Insufficient documentation

## 2017-04-04 DIAGNOSIS — Y929 Unspecified place or not applicable: Secondary | ICD-10-CM | POA: Insufficient documentation

## 2017-04-04 DIAGNOSIS — S93601A Unspecified sprain of right foot, initial encounter: Secondary | ICD-10-CM | POA: Diagnosis not present

## 2017-04-04 DIAGNOSIS — Z87891 Personal history of nicotine dependence: Secondary | ICD-10-CM | POA: Diagnosis not present

## 2017-04-04 DIAGNOSIS — W010XXA Fall on same level from slipping, tripping and stumbling without subsequent striking against object, initial encounter: Secondary | ICD-10-CM | POA: Diagnosis not present

## 2017-04-04 DIAGNOSIS — S93401A Sprain of unspecified ligament of right ankle, initial encounter: Secondary | ICD-10-CM

## 2017-04-04 DIAGNOSIS — Y999 Unspecified external cause status: Secondary | ICD-10-CM | POA: Diagnosis not present

## 2017-04-04 DIAGNOSIS — I129 Hypertensive chronic kidney disease with stage 1 through stage 4 chronic kidney disease, or unspecified chronic kidney disease: Secondary | ICD-10-CM | POA: Diagnosis not present

## 2017-04-04 DIAGNOSIS — S99921A Unspecified injury of right foot, initial encounter: Secondary | ICD-10-CM | POA: Diagnosis present

## 2017-04-04 DIAGNOSIS — Z79899 Other long term (current) drug therapy: Secondary | ICD-10-CM | POA: Diagnosis not present

## 2017-04-04 DIAGNOSIS — Y939 Activity, unspecified: Secondary | ICD-10-CM | POA: Insufficient documentation

## 2017-04-04 DIAGNOSIS — M25571 Pain in right ankle and joints of right foot: Secondary | ICD-10-CM | POA: Diagnosis not present

## 2017-04-04 MED ORDER — PENICILLIN V POTASSIUM 500 MG PO TABS: 500.0000 mg | ORAL_TABLET | Freq: Once | ORAL | Status: DC

## 2017-04-04 MED ORDER — TRAMADOL HCL 50 MG PO TABS
50.0000 mg | ORAL_TABLET | Freq: Once | ORAL | Status: DC
  Filled 2017-04-04: qty 1

## 2017-04-04 NOTE — ED Notes (Signed)
See triage note  States she tripped about 1 1/2 weeks ago  Developed right ankle pain  conts to have pain to same ankle  Ambulates with sl limp d./t pain  Positive pulses and good sensation

## 2017-04-04 NOTE — ED Provider Notes (Signed)
[facility] Emergency Department Provider Note ____________________________________________  Time seen: 1816  I have reviewed the triage vital signs and the nursing notes.  HISTORY  Chief Complaint  Ankle Pain  HPI Felicia Anderson is a 76 y.o. female presents to the ED for evaluation of continued right ankle pain after fall initially 2 weeks ago. Patient describes sitting up something, after she rolled her ankle 2 weeks prior. She did manage his symptoms at home with Tylenol or Motrin. She reports today she inadvertently hit something on top of her foot, and had increased pain to the dorsal foot and ankle. She presents now further evaluation.  Past Medical History:  Diagnosis Date  . Hypertension   . Small bowel obstruction due to adhesions [facility])     Patient Active Problem List   Diagnosis Date Noted  . Concentration deficit 01/12/2017  . CKD (chronic kidney disease) stage 3, GFR 30-59 ml/min 09/08/2016  . Hypertension 08/25/2016  . Chronic constipation 08/25/2016  . Generalized anxiety disorder 08/25/2016    Past Surgical History:  Procedure Laterality Date  . BOWEL RESECTION    . NASAL SEPTUM SURGERY    . TUBAL LIGATION  1969    Prior to Admission medications   Medication Sig Start Date End Date Taking? Authorizing Provider  acetaminophen (TYLENOL) 500 MG tablet Take 500 mg by mouth every 6 (six) hours as needed.    [provider]  amLODipine (NORVASC) 5 MG tablet TAKE 1 TABLET (5 MG TOTAL) BY MOUTH DAILY. 02/06/17   Crecencio Mc, MD  B Complex Vitamins (VITAMIN B COMPLEX PO) Take by mouth.    [provider]  Cholecalciferol (VITAMIN D3) 100000 UNIT/GM POWD Take by mouth.    [provider]  Melatonin 5 MG TABS Take 1 tablet by mouth at bedtime.    [provider]  Multiple Vitamin (MULTIVITAMIN) tablet Take 1 tablet by mouth daily.    [provider]  sertraline (ZOLOFT) 100 MG tablet Take 1 tablet (100  mg total) by mouth daily. 02/12/17   Crecencio Mc, MD    Allergies Morphine and related  Family History  Problem Relation Age of Onset  . Cancer Mother   . Heart attack Father   . Alcohol abuse Father   . Alzheimer's disease Sister   . Cerebral aneurysm Brother   . Hypertension Daughter   . Hypertension Son   . Hypertension Daughter     Social History Social History  Substance Use Topics  . Smoking status: Former Research scientist (life sciences)  . Smokeless tobacco: Never Used  . Alcohol use No    Review of Systems  Constitutional: Negative for fever. Musculoskeletal: Negative for back pain. Right ankle pain as above. Skin: Negative for rash. Neurological: Negative for headaches, focal weakness or numbness. ____________________________________________  PHYSICAL EXAM:  VITAL SIGNS: ED Triage Vitals  Enc Vitals Group     BP 04/04/17 1725 (!) 175/98     Pulse Rate 04/04/17 1725 76     Resp 04/04/17 1725 18     Temp 04/04/17 1725 98.2 F (36.8 C)     Temp Source 04/04/17 1725 Oral     SpO2 04/04/17 1725 100 %     Weight 04/04/17 1725 128 lb (58.1 kg)     Height 04/04/17 1725 5\' 2"  (1.575 m)     Head Circumference --      Peak Flow --      Pain Score 04/04/17 1724 8     Pain  Loc --      Pain Edu? --      Excl. in [place]? --     Constitutional: Alert and oriented. Well appearing and in no distress. Head: Normocephalic and atraumatic. Cardiovascular: Normal distal pulses. Respiratory: Normal respiratory effort.  Musculoskeletal: Right foot and ankle without obvious deformity, dislocation, or effusion. No ecchymosis, bruising, erythema is appreciated. Patient is mildly tender to palpation over the dorsal aspect of the medial talus region. No medial or lateral joint pain tenderness is noted about the ankle. No calf or Achilles tenderness is noted. Normal range of motion in all planes. Negative anterior/posterior drawer. Nontender with normal range of motion in all extremities.  Neurologic:   Normal gait without ataxia. Normal speech and language. No gross focal neurologic deficits are appreciated. Skin:  Skin is warm, dry and intact. No rash noted. ____________________________________________   RADIOLOGY  Right Ankle  IMPRESSION: No acute fracture or dislocation of the right ankle.  I, Darnisha Vernet, Dannielle Karvonen, personally viewed and evaluated these images (plain radiographs) as part of my medical decision making, as well as reviewing the written report by the radiologist. ____________________________________________  PROCEDURES  Ace bandage ____________________________________________  INITIAL IMPRESSION / ASSESSMENT AND PLAN / ED COURSE  Patient was ED evaluation of right foot and ankle pain. She is found to have a negative x-ray for any acute fracture or dislocation. She is discharged with surgical management of a right foot and ankle sprain. Ace bandage is placed for support. She will follow with her primary care provider or podiatry, for ongoing symptom management. ____________________________________________  FINAL CLINICAL IMPRESSION(S) / ED DIAGNOSES  Final diagnoses:  Sprain of right ankle, unspecified ligament, initial encounter  Sprain of right foot, initial encounter      Melvenia Needles, PA-C 04/04/17 2301    Darel Hong, MD 04/04/17 2347

## 2017-04-04 NOTE — ED Triage Notes (Signed)
Patient presents to ED via POV from home. Patient had a fall 2 weeks ago, patient tripped over something. Patient reports right ankle pain. Ambulatory with limp.

## 2017-04-04 NOTE — Discharge Instructions (Addendum)
Your exam and x-ray are negative for any fracture or dislocation. Follow-up with your provider for ongoing symptoms. You may also call Dr. Vickki Muff for recheck. Take OTC Tylenol and Motrin for pain relief. Rest, ice, and elevate the foot as needed.

## 2017-04-04 NOTE — ED Notes (Signed)
Pt discharged to home.  Family member driving.  Discharge instructions reviewed.  Verbalized understanding.  No questions or concerns at this time.  Teach back verified.  Pt in NAD.  No items left in ED.   

## 2017-04-14 ENCOUNTER — Ambulatory Visit
Admission: RE | Admit: 2017-04-14 | Discharge: 2017-04-14 | Disposition: A | Discharge status: Home / Self Care | Payer: Medicare Other | Source: Ambulatory Visit | Attending: Internal Medicine | Admitting: Internal Medicine

## 2017-04-14 DIAGNOSIS — Z1231 Encounter for screening mammogram for malignant neoplasm of breast: Secondary | ICD-10-CM | POA: Insufficient documentation

## 2017-04-14 DIAGNOSIS — Z1239 Encounter for other screening for malignant neoplasm of breast: Secondary | ICD-10-CM

## 2017-04-17 NOTE — Telephone Encounter (Signed)
Error

## 2017-04-18 ENCOUNTER — Other Ambulatory Visit: Payer: Self-pay | Admitting: *Deleted

## 2017-04-18 ENCOUNTER — Inpatient Hospital Stay
Admission: RE | Admit: 2017-04-18 | Discharge: 2017-04-18 | Disposition: A | Discharge status: Home / Self Care | Payer: Self-pay | Source: Ambulatory Visit | Attending: *Deleted | Admitting: *Deleted

## 2017-04-18 DIAGNOSIS — Z9289 Personal history of other medical treatment: Secondary | ICD-10-CM

## 2017-04-21 ENCOUNTER — Ambulatory Visit (INDEPENDENT_AMBULATORY_CARE_PROVIDER_SITE_OTHER): Payer: Medicare Other | Admitting: Internal Medicine

## 2017-04-21 ENCOUNTER — Encounter: Payer: Self-pay | Admitting: Internal Medicine

## 2017-04-21 VITALS — BP 120/68 | HR 65 | Temp 98.3°F | Resp 15 | Ht 62.0 in | Wt 130.6 lb

## 2017-04-21 DIAGNOSIS — R41840 Attention and concentration deficit: Secondary | ICD-10-CM | POA: Diagnosis not present

## 2017-04-21 DIAGNOSIS — Z1231 Encounter for screening mammogram for malignant neoplasm of breast: Secondary | ICD-10-CM | POA: Diagnosis not present

## 2017-04-21 DIAGNOSIS — F411 Generalized anxiety disorder: Secondary | ICD-10-CM

## 2017-04-21 DIAGNOSIS — Z1239 Encounter for other screening for malignant neoplasm of breast: Secondary | ICD-10-CM

## 2017-04-21 DIAGNOSIS — I10 Essential (primary) hypertension: Secondary | ICD-10-CM

## 2017-04-21 DIAGNOSIS — Z1211 Encounter for screening for malignant neoplasm of colon: Secondary | ICD-10-CM | POA: Diagnosis not present

## 2017-04-21 DIAGNOSIS — R296 Repeated falls: Secondary | ICD-10-CM | POA: Diagnosis not present

## 2017-04-21 DIAGNOSIS — Z23 Encounter for immunization: Secondary | ICD-10-CM | POA: Diagnosis not present

## 2017-04-21 DIAGNOSIS — H524 Presbyopia: Secondary | ICD-10-CM | POA: Diagnosis not present

## 2017-04-21 NOTE — Progress Notes (Signed)
Patient ID: Vanassa Penniman, female    DOB: June 02, 1941  Age: 76 y.o. MRN: [id number]  The patient is here for  management of other chronic and acute problems.  Mammogram done last week    The risk factors are reflected in the social history.  The roster of all physicians providing medical care to patient - is listed in the Snapshot section of the chart.  Activities of daily living:  The patient is 100% independent in all ADLs: dressing, toileting, feeding as well as independent mobility  Home safety : The patient has smoke detectors in the home. They wear seatbelts.  There are no firearms at home. There is no violence in the home.   There is no risks for hepatitis, STDs or HIV. There is no   history of blood transfusion. They have no travel history to infectious disease endemic areas of the world.  The patient has seen their dentist in the last six month. They have seen their eye doctor in the last year. They admit to slight hearing difficulty with regard to whispered voices and some television programs.  They have deferred audiologic testing in the last year.  They do not  have excessive sun exposure. Discussed the need for sun protection: hats, long sleeves and use of sunscreen if there is significant sun exposure.   Diet: the importance of a healthy diet is discussed. They do have a healthy diet.  The benefits of regular aerobic exercise were discussed. She walks 4 times per week ,  20 minutes.   Depression screen: there are no signs or vegative symptoms of untreated depression- irritability, change in appetite, anhedonia, sadness/tearfullness.  She does continue to report anxiety which is partly reactive to situation and is tolerating the  change from paxil to zoloft 100 mg   Cognitive assessment: the patient has decreased concentration  And mild cognitive changes aggravated by her loss of hearing.  She feels this has imporved with the ouse of borrowed hearing aids. She is actively engaged.  They could relate day,date,year and events; recalled 2/3 objects at 3 minutes; performed clock-face test normally. She lives with her daughter [name]  The following portions of the patient's history were reviewed and updated as appropriate: allergies, current medications, past family history, past medical history,  past surgical history, past social history  and problem list.  Visual acuity was not assessed per patient preference since she has regular follow up with her ophthalmologist. Hearing and body mass index were assessed and reviewed.   During the course of the visit the patient was educated and counseled about appropriate screening and preventive services including : fall prevention , diabetes screening, nutrition counseling, colorectal cancer screening, and recommended immunizations.    CC: The primary encounter diagnosis was Falls frequently. Diagnoses of Encounter for immunization, Presbyopia of both eyes, Generalized anxiety disorder, Essential hypertension, Concentration deficit, Breast cancer screening, Screening for colon cancer, and Frequent falls were also pertinent to this visit.   1) Hypertension : in the setting of ER visit on Sept 14th for sprained ankle (right )  Sustained after tripping over something in the yard while wearing flips flops.  Has limited her activitiy to elevated foot.  Still some swelling  And a knot on the anterior surface of ankle .  X rays normal. Repeat bp normal.    2) Frequent falls:  Patient reports that she has had 4 falls since moving in with her daughter [name].  Only one has been atrributed to wearing flip  flops, one to tripping over "clutter" in Terry's house.  Two seem unprovoked by environment. She denies dizziness, orthostatic symptoms and leg weakness. Has not had vision checked in  over 5 years .  Feels that she falls to the right a lot but denies right leg weakness.     3) wearing  Borrowed hearing aids.    4) GAD:  Tolerating zoloft, but  " I  still get nervous" around [place].   Feels like she is a burden to her daughter,  Linus Mako on eggshells around her.   Continues to avoid making new social outlets . Going to [place] to visit family and "give [name] a break"  History Walida has a past medical history of Hypertension and Small bowel obstruction due to adhesions ([place]).   She has a past surgical history that includes Tubal ligation (1969); Nasal septum surgery; and Bowel resection.   Her family history includes Alcohol abuse in her father; Alzheimer's disease in her sister; Cancer in her mother; Cerebral aneurysm in her brother; Heart attack in her father; Hypertension in her daughter, daughter, and son.She reports that she has quit smoking. She has never used smokeless tobacco. She reports that she does not drink alcohol or use drugs.  Outpatient Medications Prior to Visit  Medication Sig Dispense Refill  . acetaminophen (TYLENOL) 500 MG tablet Take 500 mg by mouth every 6 (six) hours as needed.    Marland Kitchen amLODipine (NORVASC) 5 MG tablet TAKE 1 TABLET (5 MG TOTAL) BY MOUTH DAILY. 90 tablet 1  . B Complex Vitamins (VITAMIN B COMPLEX PO) Take by mouth.    . Cholecalciferol (VITAMIN D3) 100000 UNIT/GM POWD Take by mouth.    . Melatonin 5 MG TABS Take 1 tablet by mouth at bedtime.    . Multiple Vitamin (MULTIVITAMIN) tablet Take 1 tablet by mouth daily.    . sertraline (ZOLOFT) 100 MG tablet Take 1 tablet (100 mg total) by mouth daily. 30 tablet 3   No facility-administered medications prior to visit.     Review of Systems   Patient denies headache, fevers, malaise, unintentional weight loss, skin rash, eye pain, sinus congestion and sinus pain, sore throat, dysphagia,  hemoptysis , cough, dyspnea, wheezing, chest pain, palpitations, orthopnea, edema, abdominal pain, nausea, melena, diarrhea, constipation, flank pain, dysuria, hematuria, urinary  Frequency, nocturia, numbness, tingling, seizures,  Focal weakness, Loss of consciousness,  Tremor,  insomnia, depression,and suicidal ideation.      Objective:  BP 120/68 (BP Location: Left Arm, Patient Position: Sitting, Cuff Size: Normal)   Pulse 65   Temp 98.3 F (36.8 C) (Oral)   Resp 15   Ht 5\' 2"  (1.575 m)   Wt 130 lb 9.6 oz (59.2 kg)   SpO2 98%   BMI 23.89 kg/m   Physical Exam  General appearance: alert, cooperative and appears stated age Ears: normal TM's and external ear canals both ears Throat: lips, mucosa, and tongue normal; teeth and gums normal Neck: no adenopathy, no carotid bruit, supple, symmetrical, trachea midline and thyroid not enlarged, symmetric, no tenderness/mass/nodules Back: symmetric, no curvature. ROM normal. No CVA tenderness. Lungs: clear to auscultation bilaterally Heart: regular rate and rhythm, S1, S2 normal, no murmur, click, rub or gallop Abdomen: soft, non-tender; bowel sounds normal; no masses,  no organomegaly Pulses: 2+ and symmetric Skin: Skin color, texture, turgor normal. No rashes or lesions Lymph nodes: Cervical, supraclavicular, and axillary nodes normal. Neuro: CNs 2-12 intact. DTRs 2+/4 in biceps, brachioradialis, patellars and achilles. Muscle strength 5/5  in upper and lower exremities. Fine resting tremor bilaterally both hands cerebellar function normal. Romberg negative.  No pronator drift.   Gait normal.    Assessment & Plan:   Problem List Items Addressed This Visit    Breast cancer screening    Continue annual mammomgrams      Concentration deficit    Aggravated by anxiety and loss of hearing       Frequent falls    Referral for vision testing made  Neurologic exam is normal and she denies orthostatic symptoms and dizziness.  Will consider PT referral for evaluation of gait       Generalized anxiety disorder    Managed with zoloft . No changes today      Hypertension    Well controlled on current regimen. Renal function stable, no changes today.  Lab Results  Component Value Date   CREATININE 1.01  01/10/2017   Lab Results  Component Value Date   NA 141 01/10/2017   K 4.5 01/10/2017   CL 105 01/10/2017   CO2 27 01/10/2017         Screening for colon cancer    Other Visit Diagnoses    Falls frequently    -  Primary   Relevant Orders   Ambulatory referral to Physical Therapy   Encounter for immunization       Relevant Orders   Flu vaccine HIGH DOSE PF (Completed)   Presbyopia of both eyes       Relevant Orders   Ambulatory referral to Ophthalmology     A total of 40 minutes was spent with patient more than half of which was spent in counseling patient on the above mentioned issues , reviewing and explaining recent labs and imaging studies done, and coordination of care.  I am having Ms. [place] maintain her Melatonin, multivitamin, acetaminophen, amLODipine, Vitamin D3, B Complex Vitamins (VITAMIN B COMPLEX PO), and sertraline.  No orders of the defined types were placed in this encounter.   There are no discontinued medications.  Follow-up: Return in about 3 months (around 07/22/2017).   Crecencio Mc, MD

## 2017-04-21 NOTE — Patient Instructions (Signed)
Your neurologic exam is normal.  I see no deficit that would increase your risk for falling   I have made a referral for an EYE EXAM and for physical theapy to see if your  Strength in the right foot is contributing  DO NOT WEAR FLIP FLOPS,  CLOGS  OR HIGH HEELS  Wear sensible shoes. .   I WANT YOU TO MEET ONE NEW PERSON BY THE NEXT TIME I SEE YOU I N  3  MONTHS

## 2017-04-22 DIAGNOSIS — Z1239 Encounter for other screening for malignant neoplasm of breast: Secondary | ICD-10-CM | POA: Insufficient documentation

## 2017-04-22 DIAGNOSIS — Z1211 Encounter for screening for malignant neoplasm of colon: Secondary | ICD-10-CM | POA: Insufficient documentation

## 2017-04-22 DIAGNOSIS — R296 Repeated falls: Secondary | ICD-10-CM | POA: Insufficient documentation

## 2017-04-22 NOTE — Assessment & Plan Note (Signed)
Well controlled on current regimen. Renal function stable, no changes today.  Lab Results  Component Value Date   CREATININE 1.01 01/10/2017   Lab Results  Component Value Date   NA 141 01/10/2017   K 4.5 01/10/2017   CL 105 01/10/2017   CO2 27 01/10/2017

## 2017-04-22 NOTE — Assessment & Plan Note (Addendum)
Referral for vision testing made.  Her neurologic exam is normal and she denies orthostatic symptoms and dizziness.  Will consider PT referral for evaluation of gait

## 2017-04-22 NOTE — Assessment & Plan Note (Signed)
Managed with zoloft . No changes today

## 2017-04-22 NOTE — Assessment & Plan Note (Signed)
Continue annual mammomgrams

## 2017-04-22 NOTE — Assessment & Plan Note (Signed)
Aggravated by anxiety and loss of hearing

## 2017-05-12 ENCOUNTER — Ambulatory Visit: Payer: Medicare Other

## 2017-05-15 ENCOUNTER — Ambulatory Visit: Payer: Medicare Other

## 2017-06-15 ENCOUNTER — Other Ambulatory Visit: Payer: Self-pay | Admitting: Internal Medicine

## 2017-07-17 ENCOUNTER — Other Ambulatory Visit: Payer: Self-pay

## 2017-07-17 MED ORDER — SERTRALINE HCL 100 MG PO TABS: 100.0000 mg | ORAL_TABLET | Freq: Every day | ORAL | 0 refills | Status: DC

## 2017-07-25 ENCOUNTER — Telehealth: Payer: Self-pay | Admitting: Internal Medicine

## 2017-07-25 ENCOUNTER — Ambulatory Visit (INDEPENDENT_AMBULATORY_CARE_PROVIDER_SITE_OTHER): Payer: Medicare Other

## 2017-07-25 ENCOUNTER — Encounter: Payer: Self-pay | Admitting: Internal Medicine

## 2017-07-25 ENCOUNTER — Ambulatory Visit (INDEPENDENT_AMBULATORY_CARE_PROVIDER_SITE_OTHER): Payer: Medicare Other | Admitting: Internal Medicine

## 2017-07-25 VITALS — BP 140/74 | HR 66 | Temp 97.9°F | Resp 15 | Ht 62.0 in | Wt 133.8 lb

## 2017-07-25 DIAGNOSIS — M79641 Pain in right hand: Secondary | ICD-10-CM | POA: Diagnosis not present

## 2017-07-25 DIAGNOSIS — R4189 Other symptoms and signs involving cognitive functions and awareness: Secondary | ICD-10-CM

## 2017-07-25 DIAGNOSIS — R2689 Other abnormalities of gait and mobility: Secondary | ICD-10-CM

## 2017-07-25 DIAGNOSIS — M79671 Pain in right foot: Secondary | ICD-10-CM

## 2017-07-25 DIAGNOSIS — S92354A Nondisplaced fracture of fifth metatarsal bone, right foot, initial encounter for closed fracture: Secondary | ICD-10-CM | POA: Diagnosis not present

## 2017-07-25 DIAGNOSIS — R41840 Attention and concentration deficit: Secondary | ICD-10-CM

## 2017-07-25 DIAGNOSIS — F411 Generalized anxiety disorder: Secondary | ICD-10-CM | POA: Diagnosis not present

## 2017-07-25 DIAGNOSIS — S62642A Nondisplaced fracture of proximal phalanx of right middle finger, initial encounter for closed fracture: Secondary | ICD-10-CM | POA: Diagnosis not present

## 2017-07-25 MED ORDER — PAROXETINE HCL 10 MG PO TABS: 10.0000 mg | ORAL_TABLET | Freq: Every day | ORAL | 2 refills | Status: DC

## 2017-07-25 MED ORDER — ALPRAZOLAM 0.25 MG PO TABS: 0.2500 mg | ORAL_TABLET | Freq: Two times a day (BID) | ORAL | 2 refills | Status: DC | PRN

## 2017-07-25 NOTE — Progress Notes (Signed)
Subjective:  Patient ID: Felicia Anderson, female    DOB: November 05, 1940  Age: 77 y.o. MRN: [id number]  CC: The primary encounter diagnosis was Right hand and foot pain. Diagnoses of Cognitive changes, Loss of balance, Concentration deficit, Generalized anxiety disorder, and Closed nondisplaced fracture of fifth metatarsal bone of right foot, initial encounter were also pertinent to this visit.  HPI Felicia Anderson presents for follow up on multiple issues including cognitive deficits,  Frequent falls and depression with anxiety    fell one week ago getting out of the bathtub   Pain bruising and swelling of right foot.  Pain Over 5th metatarsal is the worst.    Positive depression screen today .  Constantly "walkig on eggshells" with daughter Felicia Anderson like she is in the way. wants to return to [place].   Outpatient Medications Prior to Visit  Medication Sig Dispense Refill  . acetaminophen (TYLENOL) 500 MG tablet Take 500 mg by mouth every 6 (six) hours as needed.    Marland Kitchen amLODipine (NORVASC) 5 MG tablet TAKE 1 TABLET (5 MG TOTAL) BY MOUTH DAILY. 90 tablet 1  . B Complex Vitamins (VITAMIN B COMPLEX PO) Take by mouth.    . Cholecalciferol (VITAMIN D3) 100000 UNIT/GM POWD Take by mouth.    . Melatonin 5 MG TABS Take 1 tablet by mouth at bedtime.    . Multiple Vitamin (MULTIVITAMIN) tablet Take 1 tablet by mouth daily.    . sertraline (ZOLOFT) 100 MG tablet Take 1 tablet (100 mg total) by mouth daily. (Patient not taking: Reported on 07/25/2017) 90 tablet 0   No facility-administered medications prior to visit.     Review of Systems;  Patient denies headache, fevers, malaise, unintentional weight loss, skin rash, eye pain, sinus congestion and sinus pain, sore throat, dysphagia,  hemoptysis , cough, dyspnea, wheezing, chest pain, palpitations, orthopnea, edema, abdominal pain, nausea, melena, diarrhea, constipation, flank pain, dysuria, hematuria, urinary  Frequency, nocturia, numbness, tingling,  seizures,  Focal weakness, Loss of consciousness,  Tremor, insomnia, depression, anxiety, and suicidal ideation.      Objective:  BP 140/74 (BP Location: Left Arm, Patient Position: Sitting, Cuff Size: Normal)   Pulse 66   Temp 97.9 F (36.6 C) (Oral)   Resp 15   Ht 5\' 2"  (1.575 m)   Wt 133 lb 12.8 oz (60.7 kg)   SpO2 99%   BMI 24.47 kg/m   BP Readings from Last 3 Encounters:  07/25/17 140/74  04/21/17 120/68  04/04/17 (!) 160/90    Wt Readings from Last 3 Encounters:  07/25/17 133 lb 12.8 oz (60.7 kg)  04/21/17 130 lb 9.6 oz (59.2 kg)  04/04/17 128 lb (58.1 kg)    General appearance: alert, cooperative and appears stated age Ears: normal TM's and external ear canals both ears Throat: lips, mucosa, and tongue normal; teeth and gums normal Neck: no adenopathy, no carotid bruit, supple, symmetrical, trachea midline and thyroid not enlarged, symmetric, no tenderness/mass/nodules Back: symmetric, no curvature. ROM normal. No CVA tenderness. Lungs: clear to auscultation bilaterally Heart: regular rate and rhythm, S1, S2 normal, no murmur, click, rub or gallop Abdomen: soft, non-tender; bowel sounds normal; no masses,  no organomegaly Pulses: 2+ and symmetric Skin: Skin color, texture, turgor normal. No rashes or lesions Lymph nodes: Cervical, supraclavicular, and axillary nodes normal.  No results found for: HGBA1C  Lab Results  Component Value Date   CREATININE 1.01 01/10/2017   CREATININE 0.89 10/08/2016   CREATININE 1.25 (H) 09/05/2016  Lab Results  Component Value Date   WBC 8.6 08/23/2016   HGB 12.4 08/23/2016   HCT 37.6 08/23/2016   PLT 362 08/23/2016   GLUCOSE 87 01/10/2017   CHOL 134 01/10/2017   TRIG 87.0 01/10/2017   HDL 54.10 01/10/2017   LDLCALC 63 01/10/2017   ALT 13 01/10/2017   AST 18 01/10/2017   NA 141 01/10/2017   K 4.5 01/10/2017   CL 105 01/10/2017   CREATININE 1.01 01/10/2017   BUN 20 01/10/2017   CO2 27 01/10/2017   TSH 1.92  08/23/2016     Assessment & Plan:   Problem List Items Addressed This Visit    Concentration deficit    She is having increased forgetfulness and decreased concentration.. Referral to Neurology       Fracture of 5th metatarsal    Recommend wearing a hard sole orthopedic shoe for 6 weeks  to facilitate healing of fracture.       Generalized anxiety disorder    Recommending use of Paxil daily,  With prn use of alprazolam The risks and benefits of benzodiazepine use were discussed with patient today including excessive sedation leading to respiratory depression,  impaired thinking/driving, and addiction.  Patient was advised to avoid concurrent use with alcohol, to use medication only as needed and not to share with others  .       Relevant Medications   PARoxetine (PAXIL) 10 MG tablet   ALPRAZolam (XANAX) 0.25 MG tablet    Other Visit Diagnoses    Right hand and foot pain    -  Primary   Relevant Orders   DG Foot Complete Right (Completed)   Cognitive changes       Relevant Orders   Ambulatory referral to Neurology   Loss of balance       Relevant Orders   Ambulatory referral to Neurology    A total of 25 minutes of face to face time was spent with patient more than half of which was spent in counselling about the above mentioned conditions  and coordination of care   I am having Felicia Anderson start on PARoxetine and ALPRAZolam. I am also having her maintain her Melatonin, multivitamin, acetaminophen, amLODipine, Vitamin D3, B Complex Vitamins (VITAMIN B COMPLEX PO), and sertraline.  Meds ordered this encounter  Medications  . PARoxetine (PAXIL) 10 MG tablet    Sig: Take 1 tablet (10 mg total) by mouth daily.    Dispense:  90 tablet    Refill:  2  . ALPRAZolam (XANAX) 0.25 MG tablet    Sig: Take 1 tablet (0.25 mg total) by mouth 2 (two) times daily as needed for anxiety.    Dispense:  60 tablet    Refill:  2    There are no discontinued medications.  Follow-up: No  Follow-up on file.   Crecencio Mc, MD

## 2017-07-25 NOTE — Telephone Encounter (Signed)
Pts  Daughter  Called    Requesting  Clarification   Of   X  Ray  Results   Which  Were   Given   By  Staff  To   The  Patient   Earlier  Given results  Of the  Foot  X  Ray     Pts  Daughter  Advised  To  Get  Her a  Hard  Soled   Shoe  As  Directed  .   No  Result  Note  Created  Yet  . Crm initiated   By office  Staff  Told  To  followup as  Needed

## 2017-07-25 NOTE — Telephone Encounter (Signed)
It appears she may have a hairline  fracture of the 5th metatarsal (long bone that leadss to the pinky toe) but the results are not fianl yet. If it is , she should wear a hard sole shoe  For a few weeks.this can be purchased at a medical supply store or pharmacy

## 2017-07-25 NOTE — Patient Instructions (Addendum)
I want you to start taking Paxil for your anxiety/depression   Start with one tablet EVERY DAY with dinner Increase the dose to 2 tabletS EVERY DAY  with dinNer after 2 weeks  In between you can use the  alprazolam ,  A "nerve Pill,"  BUT YOU CANNOT USE  more than 2 daily .     X rays of the right foot today.   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 Marland Kitchen

## 2017-07-25 NOTE — Telephone Encounter (Signed)
Spoke with pt and informed her of her xray results. The pt gave a verbal understanding and stated that she would wait for the final xray results before she went and bought the hard sole shoe.

## 2017-07-27 DIAGNOSIS — S92353A Displaced fracture of fifth metatarsal bone, unspecified foot, initial encounter for closed fracture: Secondary | ICD-10-CM | POA: Insufficient documentation

## 2017-07-27 NOTE — Assessment & Plan Note (Signed)
Recommend wearing a hard sole orthopedic shoe for 6 weeks  to facilitate healing of fracture.

## 2017-07-27 NOTE — Assessment & Plan Note (Addendum)
Recommending use of Paxil daily,  With prn use of alprazolam The risks and benefits of benzodiazepine use were discussed with patient today including excessive sedation leading to respiratory depression,  impaired thinking/driving, and addiction.  Patient was advised to avoid concurrent use with alcohol, to use medication only as needed and not to share with others  .

## 2017-07-27 NOTE — Assessment & Plan Note (Signed)
She is having increased forgetfulness and decreased concentration.. Referral to Neurology

## 2017-07-30 ENCOUNTER — Telehealth: Payer: Self-pay | Admitting: Internal Medicine

## 2017-07-30 NOTE — Telephone Encounter (Signed)
Patient is wondering how long she should wear the post op shoe and when should she schedule follow up?

## 2017-07-30 NOTE — Telephone Encounter (Signed)
Four weeks and we will repeat her films then

## 2017-07-30 NOTE — Telephone Encounter (Signed)
Copied from [place]. Topic: Quick Communication - See Telephone Encounter >> Jul 30, 2017  2:16 PM Felicia Anderson, NT wrote: CRM for notification. See Telephone encounter for: Pt called in and said the doctor put her in a boot and she is not sure how long she is suppose to wear it and needs to know how long is she suppose to wait for her next appointment. Pt would like  a call back.  07/30/17.

## 2017-07-31 ENCOUNTER — Telehealth: Payer: Self-pay | Admitting: Internal Medicine

## 2017-07-31 NOTE — Telephone Encounter (Signed)
Spoke with patient and confirmed that she does not need to wear a boot, only post op shoe. She said she will go get one when her daughter gets in this afternoon.

## 2017-07-31 NOTE — Telephone Encounter (Signed)
Patient has been scheduled for 08/25/17 at 3:30

## 2017-07-31 NOTE — Telephone Encounter (Signed)
Copied from [place] [id number]. Topic: Quick Communication - See Telephone Encounter >> Jul 31, 2017  2:15 PM Boyd Kerbs wrote: CRM for notification. See Telephone encounter for:   Patient called and said daughter had a boot but it is a little big and she wants to see if Medicare/medicaid will pay for a boot that fits her.   07/31/17.

## 2017-08-06 ENCOUNTER — Other Ambulatory Visit: Payer: Self-pay | Admitting: Internal Medicine

## 2017-08-06 DIAGNOSIS — N183 Chronic kidney disease, stage 3 unspecified: Secondary | ICD-10-CM

## 2017-08-25 ENCOUNTER — Encounter: Payer: Self-pay | Admitting: Internal Medicine

## 2017-08-25 ENCOUNTER — Ambulatory Visit (INDEPENDENT_AMBULATORY_CARE_PROVIDER_SITE_OTHER): Payer: Medicare Other | Admitting: Internal Medicine

## 2017-08-25 VITALS — BP 142/74 | HR 63 | Temp 97.9°F | Resp 15 | Ht 62.0 in | Wt 139.4 lb

## 2017-08-25 DIAGNOSIS — R296 Repeated falls: Secondary | ICD-10-CM | POA: Diagnosis not present

## 2017-08-25 DIAGNOSIS — R41840 Attention and concentration deficit: Secondary | ICD-10-CM

## 2017-08-25 DIAGNOSIS — F411 Generalized anxiety disorder: Secondary | ICD-10-CM

## 2017-08-25 DIAGNOSIS — S92354A Nondisplaced fracture of fifth metatarsal bone, right foot, initial encounter for closed fracture: Secondary | ICD-10-CM | POA: Diagnosis not present

## 2017-08-25 DIAGNOSIS — I10 Essential (primary) hypertension: Secondary | ICD-10-CM

## 2017-08-25 MED ORDER — PAROXETINE HCL 20 MG PO TABS: 20.0000 mg | ORAL_TABLET | Freq: Every day | ORAL | 0 refills | Status: DC

## 2017-08-25 NOTE — Progress Notes (Signed)
Subjective:  Patient ID: Felicia Anderson, female    DOB: Apr 10, 1941  Age: 77 y.o. MRN: [id number]  CC: The primary encounter diagnosis was Essential hypertension. Diagnoses of Concentration deficit, Generalized anxiety disorder, Closed nondisplaced fracture of fifth metatarsal bone of right foot, initial encounter, and Frequent falls were also pertinent to this visit.  HPI Felicia Anderson presents for one month follow up on GAD ,  5th metatarsal frature,  And cognitive deficits.  Patient was seen one month ago and paxil /alprazolam was prescribed.  She has been taking the medication but did not increase the dose or stop the zoloft.  She feels generally less anxious and denies any side effects from the medications, but still finds that she gets upset easily,  mostlye during altercations with her daughter Felicia Anderson.  Felicia Anderson fills her pill box on a weekly basis.  She is not using the alprazolam daily but does note a significant calming effect when she takes it.  Averaging 4 times per week .    Cognitive deficits:  She has had increasing difficulty with her memory (she missed her last OV because she got lost on the way here) but was able to find her way here today without issue.  She is scheduled to see Dr Melrose Nakayama in two weeks for initial evaluation.   5th metatarsal fracture.  She wore a hard sole shoe for 4 weeks and has transitioned to a tennis shoe without pain on weightbearing.  She denies any persistent swelling or bruising.   Outpatient Medications Prior to Visit  Medication Sig Dispense Refill  . acetaminophen (TYLENOL) 500 MG tablet Take 500 mg by mouth every 6 (six) hours as needed.    . ALPRAZolam (XANAX) 0.25 MG tablet Take 1 tablet (0.25 mg total) by mouth 2 (two) times daily as needed for anxiety. 60 tablet 2  . amLODipine (NORVASC) 5 MG tablet TAKE 1 TABLET (5 MG TOTAL) BY MOUTH DAILY. 90 tablet 1  . B Complex Vitamins (VITAMIN B COMPLEX PO) Take by mouth.    . Cholecalciferol (VITAMIN D3) 100000  UNIT/GM POWD Take by mouth.    . Melatonin 5 MG TABS Take 1 tablet by mouth at bedtime.    . Multiple Vitamin (MULTIVITAMIN) tablet Take 1 tablet by mouth daily.    Marland Kitchen PARoxetine (PAXIL) 10 MG tablet Take 1 tablet (10 mg total) by mouth daily. 90 tablet 2  . sertraline (ZOLOFT) 100 MG tablet Take 1 tablet (100 mg total) by mouth daily. 90 tablet 0   No facility-administered medications prior to visit.     Review of Systems;  Patient denies headache, fevers, malaise, unintentional weight loss, skin rash, eye pain, sinus congestion and sinus pain, sore throat, dysphagia,  hemoptysis , cough, dyspnea, wheezing, chest pain, palpitations, orthopnea, edema, abdominal pain, nausea, melena, diarrhea, constipation, flank pain, dysuria, hematuria, urinary  Frequency, nocturia, numbness, tingling, seizures,  Focal weakness, Loss of consciousness,  Tremor, insomnia, depression, anxiety, and suicidal ideation.      Objective:  BP (!) 142/74 (BP Location: Left Arm, Patient Position: Sitting, Cuff Size: Normal)   Pulse 63   Temp 97.9 F (36.6 C) (Oral)   Resp 15   Ht 5\' 2"  (1.575 m)   Wt 139 lb 6.4 oz (63.2 kg)   SpO2 98%   BMI 25.50 kg/m   BP Readings from Last 3 Encounters:  08/25/17 (!) 142/74  07/25/17 140/74  04/21/17 120/68    Wt Readings from Last 3 Encounters:  08/25/17 139 lb  6.4 oz (63.2 kg)  07/25/17 133 lb 12.8 oz (60.7 kg)  04/21/17 130 lb 9.6 oz (59.2 kg)    General appearance: alert, cooperative and appears stated age Ears: normal TM's and external ear canals both ears Throat: lips, mucosa, and tongue normal; teeth and gums normal Neck: no adenopathy, no carotid bruit, supple, symmetrical, trachea midline and thyroid not enlarged, symmetric, no tenderness/mass/nodules Back: symmetric, no curvature. ROM normal. No CVA tenderness. Lungs: clear to auscultation bilaterally Heart: regular rate and rhythm, S1, S2 normal, no murmur, click, rub or gallop Abdomen: soft,  non-tender; bowel sounds normal; no masses,  no organomegaly Pulses: 2+ and symmetric Skin: Skin color, texture, turgor normal. No rashes or lesions Lymph nodes: Cervical, supraclavicular, and axillary nodes normal.  No results found for: HGBA1C  Lab Results  Component Value Date   CREATININE 1.01 01/10/2017   CREATININE 0.89 10/08/2016   CREATININE 1.25 (H) 09/05/2016    Lab Results  Component Value Date   WBC 8.6 08/23/2016   HGB 12.4 08/23/2016   HCT 37.6 08/23/2016   PLT 362 08/23/2016   GLUCOSE 87 01/10/2017   CHOL 134 01/10/2017   TRIG 87.0 01/10/2017   HDL 54.10 01/10/2017   LDLCALC 63 01/10/2017   ALT 13 01/10/2017   AST 18 01/10/2017   NA 141 01/10/2017   K 4.5 01/10/2017   CL 105 01/10/2017   CREATININE 1.01 01/10/2017   BUN 20 01/10/2017   CO2 27 01/10/2017   TSH 1.92 08/23/2016    Mm Outside Films Mammo  Result Date: 04/18/2017 This examination belongs to an outside facility and is stored here for comparison purposes only.  Contact the originating outside institution for any associated report or interpretation.   Assessment & Plan:   Problem List Items Addressed This Visit    Hypertension - Primary    Elevated today.  Reviewed list of meds, patient is not taking OTC meds that could be causing,. It.  Have asked patient to recheck bp at home a minimum of 5 times over the next 4 weeks and call readings to office for adjustment of medications.        Relevant Orders   Comprehensive metabolic panel   Lipid panel   Generalized anxiety disorder    Will increase paxil dose to 20 mg and dc zoloft.  Continue prn alprazolam .  rtc 3 months       Relevant Medications   PARoxetine (PAXIL) 20 MG tablet   Frequent falls    With  Poor balance and cognitive deficits which are chronic and acknowledged by patient.  Referral to neurology in process .  She has an appointment with Dr Melrose Nakayama in 2 weeks.  TSH and B12 have been normal in the past but are repeated today  and pending        Fracture of 5th metatarsal    Secondary to fall.  She wore a hard sole shoed for 4 weeks and has self transitioned to tennis hoses.  Fracture appears to have Clinically resolved.       Concentration deficit   Relevant Orders   TSH    A total of 25 minutes of face to face time was spent with patient more than half of which was spent in counselling about the above mentioned conditions  and coordination of care   I have discontinued Feliza Ripp's sertraline. I have also changed her PARoxetine. Additionally, I am having her maintain her Melatonin, multivitamin, acetaminophen, Vitamin D3, B Complex Vitamins (  VITAMIN B COMPLEX PO), ALPRAZolam, and amLODipine.  Meds ordered this encounter  Medications  . PARoxetine (PAXIL) 20 MG tablet    Sig: Take 1 tablet (20 mg total) by mouth daily.    Dispense:  90 tablet    Refill:  0    Note dose increase.  Keep on file for future refill s    Medications Discontinued During This Encounter  Medication Reason  . sertraline (ZOLOFT) 100 MG tablet   . PARoxetine (PAXIL) 10 MG tablet     Follow-up: Return in about 3 months (around 11/22/2017).   Crecencio Mc, MD

## 2017-08-25 NOTE — Patient Instructions (Addendum)
   I am increasing the PAXIL to 20 mg daily   TAKE 2 PILLS AT THE SAME TIME OF THE 10 MG DOSE THAT YOU HAVE,  BOTH IN THE EVENING AFTER DINNER     STOP THE ZOLOFT ONCE YOU HAVE INCREASED THE PAXIL   RETURN IN 3 MONTHS

## 2017-08-26 ENCOUNTER — Encounter: Payer: Self-pay | Admitting: Internal Medicine

## 2017-08-26 LAB — COMPREHENSIVE METABOLIC PANEL
ALT: 17 U/L (ref 0–35)
AST: 24 U/L (ref 0–37)
Albumin: 4.1 g/dL (ref 3.5–5.2)
Alkaline Phosphatase: 60 U/L (ref 39–117)
BUN: 19 mg/dL (ref 6–23)
CHLORIDE: 105 meq/L (ref 96–112)
CO2: 28 mEq/L (ref 19–32)
Calcium: 8.7 mg/dL (ref 8.4–10.5)
Creatinine, Ser: 0.82 mg/dL (ref 0.40–1.20)
GFR: 72.19 mL/min (ref >60.00)
Glucose, Bld: 100 mg/dL — ABNORMAL HIGH (ref 70–99)
POTASSIUM: 4.1 meq/L (ref 3.5–5.1)
SODIUM: 141 meq/L (ref 135–145)
Total Bilirubin: 0.4 mg/dL (ref 0.2–1.2)
Total Protein: 7.2 g/dL (ref 6.0–8.3)

## 2017-08-26 LAB — LIPID PANEL
Cholesterol: 122 mg/dL (ref 0–200)
HDL: 64.8 mg/dL (ref >39.00)
LDL CALC: 25 mg/dL (ref 0–99)
NonHDL: 57.68
TRIGLYCERIDES: 161 mg/dL — AB (ref 0.0–149.0)
Total CHOL/HDL Ratio: 2
VLDL: 32.2 mg/dL (ref 0.0–40.0)

## 2017-08-26 LAB — TSH: TSH: 2.68 u[IU]/mL (ref 0.35–4.50)

## 2017-08-26 NOTE — Assessment & Plan Note (Signed)
With  Poor balance and cognitive deficits which are chronic and acknowledged by patient.  Referral to neurology in process .  She has an appointment with Dr Melrose Nakayama in 2 weeks.  TSH and B12 have been normal in the past but are repeated today and pending

## 2017-08-26 NOTE — Assessment & Plan Note (Signed)
Elevated today.  Reviewed list of meds, patient is not taking OTC meds that could be causing,. It.  Have asked patient to recheck bp at home a minimum of 5 times over the next 4 weeks and call readings to office for adjustment of medications.   

## 2017-08-26 NOTE — Assessment & Plan Note (Signed)
Secondary to fall.  She wore a hard sole shoed for 4 weeks and has self transitioned to tennis hoses.  Fracture appears to have Clinically resolved.

## 2017-08-26 NOTE — Assessment & Plan Note (Signed)
Will increase paxil dose to 20 mg and dc zoloft.  Continue prn alprazolam .  rtc 3 months

## 2017-09-02 ENCOUNTER — Telehealth: Payer: Self-pay

## 2017-09-02 DIAGNOSIS — E785 Hyperlipidemia, unspecified: Secondary | ICD-10-CM

## 2017-09-02 DIAGNOSIS — I10 Essential (primary) hypertension: Secondary | ICD-10-CM

## 2017-09-02 NOTE — Telephone Encounter (Signed)
-----   Message from Crecencio Mc, MD sent at 08/26/2017  1:47 PM EST ----- Your Cholsterol, thyroid , liver and kidney function are normal.    Your cholesterol has improved compared to prior testing . Please Plan to repeat fasting labs in 6 months.   Regards,  Dr. Derrel Nip

## 2017-09-02 NOTE — Telephone Encounter (Signed)
Spoke with pt and informed her of her lab results. Pt gave a verbal understanding. Pt also went ahead and scheduled her 6 month fasting lab appt. Labs have been ordered.   Ordered lipid panel and CMP. Is there anything else that needs to be ordered?

## 2017-09-02 NOTE — Telephone Encounter (Signed)
No, thanks 

## 2017-09-08 DIAGNOSIS — R413 Other amnesia: Secondary | ICD-10-CM | POA: Diagnosis not present

## 2017-09-08 DIAGNOSIS — H9193 Unspecified hearing loss, bilateral: Secondary | ICD-10-CM | POA: Diagnosis not present

## 2017-09-08 DIAGNOSIS — Z1389 Encounter for screening for other disorder: Secondary | ICD-10-CM | POA: Diagnosis not present

## 2017-09-08 DIAGNOSIS — F418 Other specified anxiety disorders: Secondary | ICD-10-CM | POA: Diagnosis not present

## 2017-09-11 ENCOUNTER — Telehealth: Payer: Self-pay | Admitting: Internal Medicine

## 2017-09-11 NOTE — Telephone Encounter (Signed)
Pt called for lab results; unclear what information should be conveyed to pt; conference call initiated with Thurmond Butts and pt states she wants a copy of her labs; Janett Billow will have those ready for pick up tomorrow.

## 2017-10-08 DIAGNOSIS — H6121 Impacted cerumen, right ear: Secondary | ICD-10-CM | POA: Diagnosis not present

## 2017-10-08 DIAGNOSIS — H903 Sensorineural hearing loss, bilateral: Secondary | ICD-10-CM | POA: Diagnosis not present

## 2017-11-12 ENCOUNTER — Other Ambulatory Visit: Payer: Self-pay | Admitting: Internal Medicine

## 2017-11-13 NOTE — Telephone Encounter (Signed)
Spoke with pt and she stated that she would look at her medication to see if she has enough to get her through until Monday when Dr. Derrel Nip returns from vacation then give [place] a call back.

## 2017-11-13 NOTE — Telephone Encounter (Signed)
Pt has plenty on hand to get thru the weekend per call from her daughter.

## 2017-11-14 NOTE — Telephone Encounter (Signed)
Medication refill has been called in.

## 2017-11-14 NOTE — Telephone Encounter (Signed)
Please phone in to pharmacy 

## 2017-12-23 ENCOUNTER — Ambulatory Visit (INDEPENDENT_AMBULATORY_CARE_PROVIDER_SITE_OTHER): Payer: Medicare Other

## 2017-12-23 VITALS — BP 122/64 | HR 60 | Temp 98.1°F | Resp 14 | Ht 62.0 in | Wt 138.1 lb

## 2017-12-23 DIAGNOSIS — Z0000 Encounter for general adult medical examination without abnormal findings: Secondary | ICD-10-CM

## 2017-12-23 NOTE — Patient Instructions (Addendum)
Fall Prevention in the Home Falls can cause injuries. They can happen to people of all ages. There are many things you can do to make your home safe and to help prevent falls. What can I do on the outside of my home?  Regularly fix the edges of walkways and driveways and fix any cracks.  Remove anything that might make you trip as you walk through a door, such as a raised step or threshold.  Trim any bushes or trees on the path to your home.  Use bright outdoor lighting.  Clear any walking paths of anything that might make someone trip, such as rocks or tools.  Regularly check to see if handrails are loose or broken. Make sure that both sides of any steps have handrails.  Any raised decks and porches should have guardrails on the edges.  Have any leaves, snow, or ice cleared regularly.  Use sand or salt on walking paths during winter.  Clean up any spills in your garage right away. This includes oil or grease spills. What can I do in the bathroom?  Use night lights.  Install grab bars by the toilet and in the tub and shower. Do not use towel bars as grab bars.  Use non-skid mats or decals in the tub or shower.  If you need to sit down in the shower, use a plastic, non-slip stool.  Keep the floor dry. Clean up any water that spills on the floor as soon as it happens.  Remove soap buildup in the tub or shower regularly.  Attach bath mats securely with double-sided non-slip rug tape.  Do not have throw rugs and other things on the floor that can make you trip. What can I do in the bedroom?  Use night lights.  Make sure that you have a light by your bed that is easy to reach.  Do not use any sheets or blankets that are too big for your bed. They should not hang down onto the floor.  Have a firm chair that has side arms. You can use this for support while you get dressed.  Do not have throw rugs and other things on the floor that can make you trip. What can I do in  the kitchen?  Clean up any spills right away.  Avoid walking on wet floors.  Keep items that you use a lot in easy-to-reach places.  If you need to reach something above you, use a strong step stool that has a grab bar.  Keep electrical cords out of the way.  Do not use floor polish or wax that makes floors slippery. If you must use wax, use non-skid floor wax.  Do not have throw rugs and other things on the floor that can make you trip. What can I do with my stairs?  Do not leave any items on the stairs.  Make sure that there are handrails on both sides of the stairs and use them. Fix handrails that are broken or loose. Make sure that handrails are as long as the stairways.  Check any carpeting to make sure that it is firmly attached to the stairs. Fix any carpet that is loose or worn.  Avoid having throw rugs at the top or bottom of the stairs. If you do have throw rugs, attach them to the floor with carpet tape.  Make sure that you have a light switch at the top of the stairs and the bottom of the stairs. If you  do not have them, ask someone to add them for you. What else can I do to help prevent falls?  Wear shoes that: ? Do not have high heels. ? Have rubber bottoms. ? Are comfortable and fit you well. ? Are closed at the toe. Do not wear sandals.  If you use a stepladder: ? Make sure that it is fully opened. Do not climb a closed stepladder. ? Make sure that both sides of the stepladder are locked into place. ? Ask someone to hold it for you, if possible.  Clearly mark and make sure that you can see: ? Any grab bars or handrails. ? First and last steps. ? Where the edge of each step is.  Use tools that help you move around (mobility aids) if they are needed. These include: ? Canes. ? Walkers. ? Scooters. ? Crutches.  Turn on the lights when you go into a dark area. Replace any light bulbs as soon as they burn out.  Set up your furniture so you have a clear  path. Avoid moving your furniture around.  If any of your floors are uneven, fix them.  If there are any pets around you, be aware of where they are.  Review your medicines with your doctor. Some medicines can make you feel dizzy. This can increase your chance of falling. Ask your doctor what other things that you can do to help prevent falls. This information is not intended to replace advice given to you by your health care provider. Make sure you discuss any questions you have with your health care provider. Document Released: 05/04/2009 Document Revised: 12/14/2015 Document Reviewed: 08/12/2014 Elsevier Interactive Patient Education  2018 [place]. [place] , Thank you for taking time to come for your Medicare Wellness Visit. I appreciate your ongoing commitment to your health goals. Please review the following plan we discussed and let me know if I can assist you in the future.   These are the goals we discussed: Goals    . Increase physical activity     Stretch before activity Join the gym         This is a list of the screening recommended for you and due dates:  Health Maintenance  Topic Date Due  . Tetanus Vaccine  06/08/1961  . Colon Cancer Screening  06/08/1992  . DEXA scan (bone density measurement)  06/09/2007  . Pneumonia vaccines (2 of 2 - PCV13) 07/22/2016  . Flu Shot  02/19/2018

## 2017-12-23 NOTE — Progress Notes (Signed)
Subjective:   Felicia Anderson is a 77 y.o. female who presents for an Initial Medicare Annual Wellness Visit.  Review of Systems   . No ROS.  Medicare Wellness Visit. Additional risk factors are reflected in the social history.  Cardiac Risk Factors include: advanced age (>58men, >35 women);hypertension     Objective:    Today's Vitals   12/23/17 1310  BP: 122/64  Pulse: 60  Resp: 14  Temp: 98.1 F (36.7 C)  TempSrc: Oral  SpO2: 99%  Weight: 138 lb 1.9 oz (62.7 kg)  Height: 5\' 2"  (1.575 m)   Body mass index is 25.26 kg/m.  Advanced Directives 12/23/2017 04/04/2017  Does Patient Have a Medical Advance Directive? No No  Would patient like information on creating a medical advance directive? No - Patient declined -    Current Medications (verified) Outpatient Encounter Medications as of 12/23/2017  Medication Sig  . acetaminophen (TYLENOL) 500 MG tablet Take 500 mg by mouth every 6 (six) hours as needed.  . ALPRAZolam (XANAX) 0.25 MG tablet TAKE 1 TABLET BY MOUTH 2 TIMES DAILY AS NEEDED FOR ANXIETY  . amLODipine (NORVASC) 5 MG tablet TAKE 1 TABLET (5 MG TOTAL) BY MOUTH DAILY.  . B Complex Vitamins (VITAMIN B COMPLEX PO) Take by mouth.  . Cholecalciferol (VITAMIN D3) 100000 UNIT/GM POWD Take by mouth.  . Melatonin 5 MG TABS Take 1 tablet by mouth at bedtime.  . Multiple Vitamin (MULTIVITAMIN) tablet Take 1 tablet by mouth daily.  Marland Kitchen PARoxetine (PAXIL) 20 MG tablet Take 1 tablet (20 mg total) by mouth daily.   No facility-administered encounter medications on file as of 12/23/2017.     Allergies (verified) Morphine and related   History: Past Medical History:  Diagnosis Date  . Hypertension   . Small bowel obstruction due to adhesions [facility])    Past Surgical History:  Procedure Laterality Date  . BOWEL RESECTION    . NASAL SEPTUM SURGERY    . TUBAL LIGATION  1969   Family History  Problem Relation Age of Onset  . Cancer Mother   . Heart attack Father   . Alcohol  abuse Father   . Alzheimer's disease Sister   . Cerebral aneurysm Brother   . Hypertension Daughter   . Hypertension Son   . Hypertension Daughter    Social History   Socioeconomic History  . Marital status: Divorced    Spouse name: Not on file  . Number of children: Not on file  . Years of education: Not on file  . Highest education level: Not on file  Occupational History  . Not on file  Social Needs  . Financial resource strain: Not hard at all  . Food insecurity:    Worry: Never true    Inability: Never true  . Transportation needs:    Medical: No    Non-medical: No  Tobacco Use  . Smoking status: Former Research scientist (life sciences)  . Smokeless tobacco: Never Used  Substance and Sexual Activity  . Alcohol use: No  . Drug use: No  . Sexual activity: Not on file  Lifestyle  . Physical activity:    Days per week: 7 days    Minutes per session: 40 min  . Stress: Not on file  Relationships  . Social connections:    Talks on phone: Not on file    Gets together: Not on file    Attends religious service: Not on file    Active member of club or organization: Not  on file    Attends meetings of clubs or organizations: Not on file    Relationship status: Not on file  Other Topics Concern  . Not on file  Social History Narrative  . Not on file    Tobacco Counseling Counseling given: Not Answered   Clinical Intake:  Pre-visit preparation completed: Yes  Pain : No/denies pain     Nutritional Status: BMI 25 -29 Overweight Diabetes: No  How often do you need to have someone help you when you read instructions, pamphlets, or other written materials from your doctor or pharmacy?: 1 - Never  Interpreter Needed?: No      Activities of Daily Living In your present state of health, do you have any difficulty performing the following activities: 12/23/2017  Hearing? Y  Vision? N  Difficulty concentrating or making decisions? Y  Comment Notes she has difficulty remembering  Walking  or climbing stairs? N  Dressing or bathing? N  Doing errands, shopping? N  Preparing Food and eating ? N  Using the Toilet? N  In the past six months, have you accidently leaked urine? N  Do you have problems with loss of bowel control? N  Managing your Medications? Y  Comment daughter assists  Managing your Finances? Y  Comment daughter assists  Housekeeping or managing your Housekeeping? N  Some recent data might be hidden     Immunizations and Health Maintenance Immunization History  Administered Date(s) Administered  . Influenza, High Dose Seasonal PF 08/23/2016, 04/21/2017  . Pneumococcal-Unspecified 07/23/2015   Health Maintenance Due  Topic Date Due  . TETANUS/TDAP  06/08/1961  . COLONOSCOPY  06/08/1992  . DEXA SCAN  06/09/2007  . PNA vac Low Risk Adult (2 of 2 - PCV13) 07/22/2016    Patient Care Team: Crecencio Mc, MD as PCP - General (Internal Medicine)  Indicate any recent Medical Services you may have received from other than [facility] providers in the past year (date may be approximate).     Assessment:   This is a routine wellness examination for Felicia Anderson.  The goal of the wellness visit is to assist the patient how to close the gaps in care and create a preventative care plan for the patient.   The roster of all physicians providing medical care to patient is listed in the Snapshot section of the chart.  Taking calcium VIT D as appropriate/Osteoporosis risk reviewed.    Safety issues reviewed; Smoke and carbon monoxide detectors in the home. No firearms in the home. Wears seatbelts when driving or riding with others. No violence in the home.  They do not have excessive sun exposure.  Discussed the need for sun protection: hats, long sleeves and the use of sunscreen if there is significant sun exposure.  Patient is alert, normal appearance, oriented to person/place/and time.  Correctly identified the president of the [place] and recalls of 2/3 words. Performs  simple calculations and can read correct time from watch face.  Displays appropriate judgement.  No new identified risk were noted.  No failures at ADL's or IADL's.    BMI- discussed the importance of a healthy diet, water intake and the benefits of aerobic exercise. Educational material provided.   24 hour diet recall: Regular diet  Dental- UTD.   Eye- Visual acuity not assessed per patient preference since they have regular follow up with the ophthalmologist.  Wears corrective lenses.  Sleep patterns- Sleeps 8 hours at night.  Wakes feeling rested.   Dexa Scan discussed.  Colonoscopy discussed.   Pneumococcal discussed.   TDAP vaccine deferred per patient preference.  Follow up with insurance.  Educational material provided.  Patient Concerns: None at this time. Follow up with PCP as needed.  Hearing/Vision screen Hearing Screening Comments: Hard of hearing Awaiting haring aid Followed by pcp Vision Screening Comments: Followed by [facility] Wears corrective lenses Visual acuity not assessed per patient preference   Dietary issues and exercise activities discussed: Current Exercise Habits: Home exercise routine, Type of exercise: walking, Time (Minutes): 40, Frequency (Times/Week): 7, Weekly Exercise (Minutes/Week): 280, Intensity: Mild  Goals    . Increase physical activity     Stretch before activity Join the gym        Depression Screen PHQ 2/9 Scores 12/23/2017 01/10/2017  PHQ - 2 Score 0 3  PHQ- 9 Score - 11    Fall Risk Fall Risk  12/23/2017 01/10/2017  Falls in the past year? No Yes  Number falls in past yr: - 1  Injury with Fall? - No   Cognitive Function: MMSE - Mini Mental State Exam 12/23/2017  Orientation to time 5  Orientation to Place 5  Registration 3  Attention/ Calculation 5  Recall 2  Language- name 2 objects 2  Language- repeat 1  Language- follow 3 step command 3  Language- read & follow direction 1  Write a sentence 1    Copy design 1  Total score 29        Screening Tests Health Maintenance  Topic Date Due  . TETANUS/TDAP  06/08/1961  . COLONOSCOPY  06/08/1992  . DEXA SCAN  06/09/2007  . PNA vac Low Risk Adult (2 of 2 - PCV13) 07/22/2016  . INFLUENZA VACCINE  02/19/2018      Plan:    End of life planning; Advanced aging; Advanced directives discussed.  No HCPOA/Living Will.  Additional information declined at this time.  I have personally reviewed and noted the following in the patient's chart:   . Medical and social history . Use of alcohol, tobacco or illicit drugs  . Current medications and supplements . Functional ability and status . Nutritional status . Physical activity . Advanced directives . List of other physicians . Hospitalizations, surgeries, and ER visits in previous 12 months . Vitals . Screenings to include cognitive, depression, and falls . Referrals and appointments  In addition, I have reviewed and discussed with patient certain preventive protocols, quality metrics, and best practice recommendations. A written personalized care plan for preventive services as well as general preventive health recommendations were provided to patient.     Varney Biles, LPN   07/27/1094

## 2017-12-26 ENCOUNTER — Ambulatory Visit
Admission: RE | Admit: 2017-12-26 | Discharge: 2017-12-26 | Disposition: A | Discharge status: Home / Self Care | Payer: Medicare Other | Source: Ambulatory Visit | Attending: Neurology | Admitting: Neurology

## 2017-12-26 ENCOUNTER — Other Ambulatory Visit: Payer: Self-pay | Admitting: Neurology

## 2017-12-26 DIAGNOSIS — Z1389 Encounter for screening for other disorder: Secondary | ICD-10-CM

## 2017-12-26 DIAGNOSIS — Z0389 Encounter for observation for other suspected diseases and conditions ruled out: Secondary | ICD-10-CM | POA: Diagnosis not present

## 2018-01-02 ENCOUNTER — Other Ambulatory Visit: Payer: Self-pay | Admitting: Neurology

## 2018-01-02 ENCOUNTER — Other Ambulatory Visit (HOSPITAL_COMMUNITY): Payer: Self-pay | Admitting: Neurology

## 2018-01-02 DIAGNOSIS — R413 Other amnesia: Secondary | ICD-10-CM

## 2018-01-05 DIAGNOSIS — F5105 Insomnia due to other mental disorder: Secondary | ICD-10-CM | POA: Diagnosis not present

## 2018-01-05 DIAGNOSIS — F39 Unspecified mood [affective] disorder: Secondary | ICD-10-CM | POA: Diagnosis not present

## 2018-01-05 DIAGNOSIS — F0281 Dementia in other diseases classified elsewhere with behavioral disturbance: Secondary | ICD-10-CM | POA: Diagnosis not present

## 2018-01-09 ENCOUNTER — Ambulatory Visit (HOSPITAL_COMMUNITY)
Admission: RE | Admit: 2018-01-09 | Discharge: 2018-01-09 | Disposition: A | Discharge status: Home / Self Care | Payer: Medicare Other | Source: Ambulatory Visit | Attending: Neurology | Admitting: Neurology

## 2018-01-09 DIAGNOSIS — R9389 Abnormal findings on diagnostic imaging of other specified body structures: Secondary | ICD-10-CM | POA: Diagnosis not present

## 2018-01-09 DIAGNOSIS — R413 Other amnesia: Secondary | ICD-10-CM

## 2018-01-14 DIAGNOSIS — H9193 Unspecified hearing loss, bilateral: Secondary | ICD-10-CM | POA: Diagnosis not present

## 2018-01-14 DIAGNOSIS — G301 Alzheimer's disease with late onset: Secondary | ICD-10-CM | POA: Diagnosis not present

## 2018-01-14 DIAGNOSIS — F418 Other specified anxiety disorders: Secondary | ICD-10-CM | POA: Diagnosis not present

## 2018-01-14 DIAGNOSIS — F0281 Dementia in other diseases classified elsewhere with behavioral disturbance: Secondary | ICD-10-CM | POA: Diagnosis not present

## 2018-01-20 DIAGNOSIS — F0281 Dementia in other diseases classified elsewhere with behavioral disturbance: Secondary | ICD-10-CM | POA: Diagnosis not present

## 2018-01-20 DIAGNOSIS — F39 Unspecified mood [affective] disorder: Secondary | ICD-10-CM | POA: Diagnosis not present

## 2018-01-20 DIAGNOSIS — F5105 Insomnia due to other mental disorder: Secondary | ICD-10-CM | POA: Diagnosis not present

## 2018-02-17 DIAGNOSIS — F5105 Insomnia due to other mental disorder: Secondary | ICD-10-CM | POA: Diagnosis not present

## 2018-02-17 DIAGNOSIS — F0281 Dementia in other diseases classified elsewhere with behavioral disturbance: Secondary | ICD-10-CM | POA: Diagnosis not present

## 2018-02-17 DIAGNOSIS — F39 Unspecified mood [affective] disorder: Secondary | ICD-10-CM | POA: Diagnosis not present

## 2018-02-27 ENCOUNTER — Telehealth: Payer: Self-pay | Admitting: Radiology

## 2018-02-27 DIAGNOSIS — N183 Chronic kidney disease, stage 3 unspecified: Secondary | ICD-10-CM

## 2018-02-27 NOTE — Telephone Encounter (Signed)
Pt coming in for labs Monday, please place future orders. Thank you 

## 2018-02-27 NOTE — Addendum Note (Signed)
Addended by: Crecencio Mc on: 02/27/2018 03:49 PM   Modules accepted: Orders

## 2018-02-27 NOTE — Telephone Encounter (Signed)
Just a cmet needed

## 2018-03-02 ENCOUNTER — Other Ambulatory Visit: Payer: Medicare Other

## 2018-03-04 ENCOUNTER — Other Ambulatory Visit: Payer: Self-pay | Admitting: Internal Medicine

## 2018-03-04 DIAGNOSIS — N183 Chronic kidney disease, stage 3 unspecified: Secondary | ICD-10-CM

## 2018-04-07 DIAGNOSIS — H2510 Age-related nuclear cataract, unspecified eye: Secondary | ICD-10-CM | POA: Diagnosis not present

## 2018-06-04 ENCOUNTER — Other Ambulatory Visit: Payer: Self-pay | Admitting: Internal Medicine

## 2018-06-04 DIAGNOSIS — F39 Unspecified mood [affective] disorder: Secondary | ICD-10-CM | POA: Diagnosis not present

## 2018-06-04 DIAGNOSIS — N183 Chronic kidney disease, stage 3 unspecified: Secondary | ICD-10-CM

## 2018-06-04 DIAGNOSIS — F0281 Dementia in other diseases classified elsewhere with behavioral disturbance: Secondary | ICD-10-CM | POA: Diagnosis not present

## 2018-06-04 DIAGNOSIS — F5105 Insomnia due to other mental disorder: Secondary | ICD-10-CM | POA: Diagnosis not present

## 2018-09-15 DIAGNOSIS — F39 Unspecified mood [affective] disorder: Secondary | ICD-10-CM | POA: Diagnosis not present

## 2018-09-15 DIAGNOSIS — F0281 Dementia in other diseases classified elsewhere with behavioral disturbance: Secondary | ICD-10-CM | POA: Diagnosis not present

## 2018-09-15 DIAGNOSIS — F5105 Insomnia due to other mental disorder: Secondary | ICD-10-CM | POA: Diagnosis not present

## 2018-10-09 DIAGNOSIS — Z79899 Other long term (current) drug therapy: Secondary | ICD-10-CM | POA: Diagnosis not present

## 2018-10-09 DIAGNOSIS — F39 Unspecified mood [affective] disorder: Secondary | ICD-10-CM | POA: Diagnosis not present

## 2018-11-06 ENCOUNTER — Other Ambulatory Visit: Payer: Self-pay

## 2018-11-06 ENCOUNTER — Encounter: Payer: Self-pay | Admitting: Internal Medicine

## 2018-11-06 ENCOUNTER — Other Ambulatory Visit (INDEPENDENT_AMBULATORY_CARE_PROVIDER_SITE_OTHER): Payer: Medicare Other

## 2018-11-06 ENCOUNTER — Ambulatory Visit (INDEPENDENT_AMBULATORY_CARE_PROVIDER_SITE_OTHER): Payer: Medicare Other | Admitting: Internal Medicine

## 2018-11-06 DIAGNOSIS — Z7189 Other specified counseling: Secondary | ICD-10-CM | POA: Diagnosis not present

## 2018-11-06 DIAGNOSIS — G301 Alzheimer's disease with late onset: Secondary | ICD-10-CM

## 2018-11-06 DIAGNOSIS — F0280 Dementia in other diseases classified elsewhere without behavioral disturbance: Secondary | ICD-10-CM | POA: Diagnosis not present

## 2018-11-06 DIAGNOSIS — N183 Chronic kidney disease, stage 3 unspecified: Secondary | ICD-10-CM

## 2018-11-06 DIAGNOSIS — F411 Generalized anxiety disorder: Secondary | ICD-10-CM | POA: Diagnosis not present

## 2018-11-06 DIAGNOSIS — I10 Essential (primary) hypertension: Secondary | ICD-10-CM | POA: Diagnosis not present

## 2018-11-06 NOTE — Addendum Note (Signed)
Addended by: Leeanne Rio on: 11/06/2018 03:04 PM   Modules accepted: Orders

## 2018-11-06 NOTE — Progress Notes (Signed)
Virtual Visit via telephone visit   This visit type was conducted due to national recommendations for restrictions regarding the COVID-19 pandemic (e.g. social distancing).  This format is felt to be most appropriate for this patient at this time.  All issues noted in this document were discussed and addressed.  No physical exam was performed (except for noted visual exam findings with Video Visits).   I connected with@ on 11/06/18 at 11:00 AM EDT by a video enabled telemedicine application or telephone and verified that I am speaking with the correct person using two identifiers. Location patient: home Location provider: work or home office Persons participating in the virtual visit: patient, provider  I discussed the limitations, risks, security and privacy concerns of performing an evaluation and management service by telephone and the availability of in person appointments. I also discussed with the patient that there may be a patient responsible charge related to this service. The patient expressed understanding and agreed to proceed.  Interactive audio and video telecommunications were attempted between this provider and patient, however failed, due to patient having technical difficulties OR patient did not have access to video capability.  We continued and completed visit with audio only.   Reason for visit: follow up on hypertension, last seen Feb 2019  The patient has no signs or symptoms of COVID 19 infection (fever, cough and shortness of breath).   HPI:  1) Hypertension: patient checks blood pressure twice weekly at home.  Readings have been for the most part < 140/80 at rest . Patient is following a reduced salt diet most days and is taking amlodipine as prescribed  2) GAD ; Paxil and alprazolam Discontinued by psychiatry  Taking citalopram and seroquel..  Feels less anxious.  Getting along with [name] better .   Sleeping well,  Adjusting well .  worried about her weight gain  which  is < 5 lbs .  3) seasonal allergies;  using claritin   4)) Alzheimers Dementia : diagnosed in June 2019 with hippocampal atrophy on MRI and SLUMS core of 21/30 evaluation was done by Dr. [name]. notes reviewed.   Galantamine was prescribed but unaffordable   5) Patient is taking her medications as prescribed and notes no adverse effects.  Home BP readings have been done about once per week and are  generally < 130/80 .  She is avoiding added salt in her diet .  Not exercising regularly.  . ROS: See pertinent positives and negatives per HPI.  Past Medical History:  Diagnosis Date  . Hypertension   . Small bowel obstruction due to adhesions [facility])     Past Surgical History:  Procedure Laterality Date  . BOWEL RESECTION    . NASAL SEPTUM SURGERY    . TUBAL LIGATION  1969    Family History  Problem Relation Age of Onset  . Cancer Mother   . Heart attack Father   . Alcohol abuse Father   . Alzheimer's disease Sister   . Cerebral aneurysm Brother   . Hypertension Daughter   . Hypertension Son   . Hypertension Daughter     SOCIAL HX: widowed,lives with daughter [name] .     Current Outpatient Medications:  .  acetaminophen (TYLENOL) 500 MG tablet, Take 500 mg by mouth every 6 (six) hours as needed., Disp: , Rfl:  .  amLODipine (NORVASC) 5 MG tablet, TAKE 1 TABLET (5 MG TOTAL) BY MOUTH DAILY., Disp: 90 tablet, Rfl: 1 .  B Complex Vitamins (VITAMIN B COMPLEX  PO), Take by mouth., Disp: , Rfl:  .  Cholecalciferol (VITAMIN D3) 100000 UNIT/GM POWD, Take by mouth., Disp: , Rfl:  .  citalopram (CELEXA) 20 MG tablet, Take 20 mg by mouth every morning., Disp: , Rfl:  .  loratadine (CLARITIN) 10 MG tablet, Take 10 mg by mouth daily., Disp: , Rfl:  .  Melatonin 5 MG TABS, Take 1 tablet by mouth at bedtime., Disp: , Rfl:  .  Multiple Vitamin (MULTIVITAMIN) tablet, Take 1 tablet by mouth daily., Disp: , Rfl:  .  QUEtiapine (SEROQUEL) 25 MG tablet, TAKE 1 TABLET BY MOUTH EVERYDAY AT BEDTIME,  Disp: , Rfl:  .  vitamin B-12 (CYANOCOBALAMIN) 1000 MCG tablet, Take 1,000 mcg by mouth daily., Disp: , Rfl:   EXAM:  VITALS per patient if applicable:  GENERAL: alert, oriented, appears well and in no acute distress  HEENT: atraumatic, conjunttiva clear, no obvious abnormalities on inspection of external nose and ears  NECK: normal movements of the head and neck  LUNGS: on inspection no signs of respiratory distress, breathing rate appears normal, no obvious gross SOB, gasping or wheezing  CV: no obvious cyanosis  MS: moves all visible extremities without noticeable abnormality  PSYCH/NEURO: pleasant and cooperative, no obvious depression or anxiety, speech and thought processing grossly intact  ASSESSMENT AND PLAN:  Discussed the following assessment and plan:  Generalized anxiety disorder  Essential hypertension  Late onset Alzheimer's disease without behavioral disturbance (HCC)  Generalized anxiety disorder Improvement in symptoms noted with change in therapy  To citalopram and Seroquel by Dr [name]  Having the anticipated weight gain with Seroquel,  Walking daily for exercise advised   Hypertension Well controlled on current regimen. Renal function stable, no changes today.  Alzheimer's dementia without behavioral disturbance ([place]) Diagnosed by Dr [name] in June 2019 with hippocampal atrophy and SUS score of 21/30.  She could not afford galamantine    I discussed the assessment and treatment plan with the patient. The patient was provided an opportunity to ask questions and all were answered. The patient agreed with the plan and demonstrated an understanding of the instructions.   The patient was advised to call back or seek an in-person evaluation if the symptoms worsen or if the condition fails to improve as anticipated.  I provided 25 minutes of non-face-to-face time during this encounter.   Crecencio Mc, MD

## 2018-11-07 LAB — COMPREHENSIVE METABOLIC PANEL
AG Ratio: 1.6 (calc) (ref 1.0–2.5)
ALT: 14 U/L (ref 6–29)
AST: 20 U/L (ref 10–35)
Albumin: 4.1 g/dL (ref 3.6–5.1)
Alkaline phosphatase (APISO): 83 U/L (ref 37–153)
BUN/Creatinine Ratio: 21 (calc) (ref 6–22)
BUN: 24 mg/dL (ref 7–25)
CO2: 25 mmol/L (ref 20–32)
Calcium: 9 mg/dL (ref 8.6–10.4)
Chloride: 106 mmol/L (ref 98–110)
Creat: 1.14 mg/dL — ABNORMAL HIGH (ref 0.60–0.93)
Globulin: 2.6 g/dL (calc) (ref 1.9–3.7)
Glucose, Bld: 95 mg/dL (ref 65–99)
Potassium: 4.4 mmol/L (ref 3.5–5.3)
Sodium: 141 mmol/L (ref 135–146)
Total Bilirubin: 0.6 mg/dL (ref 0.2–1.2)
Total Protein: 6.7 g/dL (ref 6.1–8.1)

## 2018-11-08 DIAGNOSIS — Z7189 Other specified counseling: Secondary | ICD-10-CM | POA: Insufficient documentation

## 2018-11-08 NOTE — Assessment & Plan Note (Signed)
COVID-19 Education: The signs and symptoms of COVID-19 were discussed with the patient and how to seek care for testing (follow up with PCP or arrange E-visit).  The importance of social distancing was discussed today 

## 2018-11-08 NOTE — Assessment & Plan Note (Signed)
Well controlled on current regimen. Renal function stable, no changes today. 

## 2018-11-08 NOTE — Assessment & Plan Note (Addendum)
Improvement in symptoms noted with change in therapy  To citalopram and Seroquel by Dr [name]  Having the anticipated weight gain with Seroquel,  Walking daily for exercise advised

## 2018-11-08 NOTE — Assessment & Plan Note (Signed)
Diagnosed by Dr [name] in June 2019 with hippocampal atrophy and SUS score of 21/30.  She could not afford galamantine

## 2018-11-10 ENCOUNTER — Telehealth: Payer: Self-pay | Admitting: Internal Medicine

## 2018-11-10 NOTE — Telephone Encounter (Signed)
Patient called and left a message stating she is returning a call and that it is urgent she speaks with the doctor or her [place]

## 2018-11-10 NOTE — Telephone Encounter (Signed)
Pt. Called back stating she missed a call from Dr. Lupita Dawn nurse, and would like a call back please.

## 2018-11-10 NOTE — Telephone Encounter (Signed)
Please see result note message.

## 2018-11-26 DIAGNOSIS — F39 Unspecified mood [affective] disorder: Secondary | ICD-10-CM | POA: Diagnosis not present

## 2018-11-26 DIAGNOSIS — F0281 Dementia in other diseases classified elsewhere with behavioral disturbance: Secondary | ICD-10-CM | POA: Diagnosis not present

## 2018-11-26 DIAGNOSIS — F5105 Insomnia due to other mental disorder: Secondary | ICD-10-CM | POA: Diagnosis not present

## 2018-12-31 DIAGNOSIS — F0281 Dementia in other diseases classified elsewhere with behavioral disturbance: Secondary | ICD-10-CM | POA: Diagnosis not present

## 2018-12-31 DIAGNOSIS — F5105 Insomnia due to other mental disorder: Secondary | ICD-10-CM | POA: Diagnosis not present

## 2018-12-31 DIAGNOSIS — F39 Unspecified mood [affective] disorder: Secondary | ICD-10-CM | POA: Diagnosis not present

## 2019-01-16 DIAGNOSIS — F0281 Dementia in other diseases classified elsewhere with behavioral disturbance: Secondary | ICD-10-CM | POA: Diagnosis not present

## 2019-01-16 DIAGNOSIS — F39 Unspecified mood [affective] disorder: Secondary | ICD-10-CM | POA: Diagnosis not present

## 2019-01-16 DIAGNOSIS — F5105 Insomnia due to other mental disorder: Secondary | ICD-10-CM | POA: Diagnosis not present

## 2019-01-26 ENCOUNTER — Ambulatory Visit (INDEPENDENT_AMBULATORY_CARE_PROVIDER_SITE_OTHER): Payer: Medicare Other | Admitting: Family Medicine

## 2019-01-26 ENCOUNTER — Telehealth: Payer: Self-pay

## 2019-01-26 ENCOUNTER — Other Ambulatory Visit: Payer: Self-pay

## 2019-01-26 DIAGNOSIS — J302 Other seasonal allergic rhinitis: Secondary | ICD-10-CM

## 2019-01-26 DIAGNOSIS — R0602 Shortness of breath: Secondary | ICD-10-CM

## 2019-01-26 DIAGNOSIS — R609 Edema, unspecified: Secondary | ICD-10-CM

## 2019-01-26 NOTE — Progress Notes (Signed)
Patient ID: Felicia Anderson, female   DOB: May 23, 1941, 78 y.o.   MRN: [id number]    Virtual Visit via video Note  This visit type was conducted due to national recommendations for restrictions regarding the COVID-19 pandemic (e.g. social distancing).  This format is felt to be most appropriate for this patient at this time.  All issues noted in this document were discussed and addressed.  No physical exam was performed (except for noted visual exam findings with Video Visits).   I connected with Oliver Barre today at  4:00 PM EDT by a video enabled telemedicine application  and verified that I am speaking with the correct person using two identifiers. Location patient: home Location provider: work or home office Persons participating in the virtual visit: patient, provider, patients daughter  I discussed the limitations, risks, security and privacy concerns of performing an evaluation and management service by video and the availability of in person appointments. I also discussed with the patient that there may be a patient responsible charge related to this service. The patient expressed understanding and agreed to proceed.   HPI:  Patient, patient's daughter and I connected via video due to come patient complaining of bilateral leg swelling and feeling of shortness of breath in the morning.  Patient states the leg swelling will usually occur after she is up on her feet or sitting with her feet down, but usually goes away after elevating legs.  Patient states she was outdoors for many hours over the past couple of days due to the nice weather and it being the holiday weekend.  Now that it is the afternoon and she has elevated her feet for a few hours, the swelling has gone down.  Denies any pain or weeping from the legs.  Patient states there have been a few times over the past month and a half where she is felt short of breath/unable to get a good deep breath in the morning, but the symptom usually  resolves as the day goes on.  Denies any cough, any phlegm production, chest pain or wheezing.  Patient states she tries to remember to take her allergy medication every day, but does forget at times.  No fever or chills.  Appetite has been normal.  Tries to keep self hydrated due to it being hot outdoors.  Urinating normally having no issues with bowel movements.   ROS: See pertinent positives and negatives per HPI.  Past Medical History:  Diagnosis Date  . Hypertension   . Small bowel obstruction due to adhesions [facility])     Past Surgical History:  Procedure Laterality Date  . BOWEL RESECTION    . NASAL SEPTUM SURGERY    . TUBAL LIGATION  1969    Family History  Problem Relation Age of Onset  . Cancer Mother   . Heart attack Father   . Alcohol abuse Father   . Alzheimer's disease Sister   . Cerebral aneurysm Brother   . Hypertension Daughter   . Hypertension Son   . Hypertension Daughter    Social History   Tobacco Use  . Smoking status: Former Research scientist (life sciences)  . Smokeless tobacco: Never Used  Substance Use Topics  . Alcohol use: No    Current Outpatient Medications:  .  acetaminophen (TYLENOL) 500 MG tablet, Take 500 mg by mouth every 6 (six) hours as needed., Disp: , Rfl:  .  amLODipine (NORVASC) 5 MG tablet, TAKE 1 TABLET (5 MG TOTAL) BY MOUTH DAILY., Disp: 90 tablet, Rfl:  1 .  B Complex Vitamins (VITAMIN B COMPLEX PO), Take by mouth., Disp: , Rfl:  .  Cholecalciferol (VITAMIN D3) 100000 UNIT/GM POWD, Take by mouth., Disp: , Rfl:  .  citalopram (CELEXA) 20 MG tablet, Take 20 mg by mouth every morning., Disp: , Rfl:  .  loratadine (CLARITIN) 10 MG tablet, Take 10 mg by mouth daily., Disp: , Rfl:  .  Melatonin 5 MG TABS, Take 1 tablet by mouth at bedtime., Disp: , Rfl:  .  Multiple Vitamin (MULTIVITAMIN) tablet, Take 1 tablet by mouth daily., Disp: , Rfl:  .  QUEtiapine (SEROQUEL) 25 MG tablet, TAKE 1 TABLET BY MOUTH EVERYDAY AT BEDTIME, Disp: , Rfl:  .  vitamin B-12  (CYANOCOBALAMIN) 1000 MCG tablet, Take 1,000 mcg by mouth daily., Disp: , Rfl:   EXAM:  GENERAL: alert, oriented, appears well and in no acute distress  HEENT: atraumatic, conjunttiva clear, no obvious abnormalities on inspection of external nose and ears  NECK: normal movements of the head and neck  LUNGS: on inspection no signs of respiratory distress, breathing rate appears normal, no obvious gross SOB, gasping, coughing or wheezing. Speaking in full sentences  LEs: No pitting edema noted over our video feed when daughter placed camera at patient's legs.  Skin of legs does not appear red or of concern for cellulitis.  No weeping.  CV: no obvious cyanosis  MS: moves all visible extremities without noticeable abnormality  PSYCH/NEURO: pleasant and cooperative, no obvious depression or anxiety, speech and thought processing grossly intact  ASSESSMENT AND PLAN:  Discussed the following assessment and plan:  Leg swelling-suspect patient's leg swelling is result of dependent edema after being on feet or seated with feet on the floor.  Swelling resolves with elevation of legs.  Patient advised to be sure and elevate the legs whenever able to to help avoid development of swelling.  Shortness of breath/seasonal allergies-suspect patient complaining of feeling short of breath/unable to take a good deep breath in the mornings and this resolves as the day goes on as a result of some congestion that developed overnight from seasonal allergies.  Patient recommended to take her antihistamine Claritin every day and try to avoid allergens such as dust, pollen.    I discussed the assessment and treatment plan with the patient. The patient was provided an opportunity to ask questions and all were answered. The patient agreed with the plan and demonstrated an understanding of the instructions.   The patient was advised to call back or seek an in-person evaluation if the symptoms worsen or if the  condition fails to improve as anticipated.  I provided 15 minutes of video-face-to-face time during this encounter.   Jodelle Green, FNP

## 2019-01-26 NOTE — Telephone Encounter (Signed)
Close  

## 2019-02-08 ENCOUNTER — Telehealth: Payer: Self-pay | Admitting: *Deleted

## 2019-02-08 DIAGNOSIS — R944 Abnormal results of kidney function studies: Secondary | ICD-10-CM

## 2019-02-08 NOTE — Telephone Encounter (Signed)
Please place future orders for lab appt.  

## 2019-02-09 ENCOUNTER — Other Ambulatory Visit: Payer: Medicare Other

## 2019-02-16 ENCOUNTER — Telehealth: Payer: Self-pay

## 2019-02-16 NOTE — Telephone Encounter (Signed)
Patient scheduled follow-up to close 4 [facility] patient care gaps. She has appointment 03/01/19.

## 2019-02-17 ENCOUNTER — Other Ambulatory Visit: Payer: Self-pay | Admitting: Internal Medicine

## 2019-02-17 DIAGNOSIS — N183 Chronic kidney disease, stage 3 unspecified: Secondary | ICD-10-CM

## 2019-02-26 ENCOUNTER — Telehealth: Payer: Self-pay | Admitting: *Deleted

## 2019-02-26 NOTE — Telephone Encounter (Signed)
Copied from [place] [phone]. Topic: Appointment Scheduling - Scheduling Inquiry for Clinic >> Feb 26, 2019 12:54 PM Oneta Rack wrote: Patient has an appointment on 03/01/2019 please send doxy link to DOXY [phone]

## 2019-02-26 NOTE — Telephone Encounter (Signed)
Appointment updated.

## 2019-03-01 ENCOUNTER — Other Ambulatory Visit: Payer: Self-pay

## 2019-03-01 ENCOUNTER — Ambulatory Visit (INDEPENDENT_AMBULATORY_CARE_PROVIDER_SITE_OTHER): Payer: Medicare Other | Admitting: Internal Medicine

## 2019-03-01 ENCOUNTER — Encounter: Payer: Self-pay | Admitting: Internal Medicine

## 2019-03-01 DIAGNOSIS — I10 Essential (primary) hypertension: Secondary | ICD-10-CM | POA: Diagnosis not present

## 2019-03-01 DIAGNOSIS — R296 Repeated falls: Secondary | ICD-10-CM

## 2019-03-01 DIAGNOSIS — F411 Generalized anxiety disorder: Secondary | ICD-10-CM

## 2019-03-01 NOTE — Progress Notes (Signed)
Virtual Visit via [email or web address] Note  This visit type was conducted due to national recommendations for restrictions regarding the COVID-19 pandemic (e.g. social distancing).  This format is felt to be most appropriate for this patient at this time.  All issues noted in this document were discussed and addressed.  No physical exam was performed (except for noted visual exam findings with Video Visits).   I connected with@ on 03/01/19 at  3:00 PM EDT by a video enabled telemedicine application or telephone and verified that I am speaking with the correct person using two identifiers. Location patient: home Location provider: work or home office Persons participating in the virtual visit: patient, provider  I discussed the limitations, risks, security and privacy concerns of performing an evaluation and management service by telephone and the availability of in person appointments. I also discussed with the patient that there may be a patient responsible charge related to this service. The patient expressed understanding and agreed to proceed.   Reason for visit: follow up  HPI:  78 yr old with hypertension, GAD, dementia and frequent falls, here for 3 month follow up.   Patient is taking her medications as prescribed and notes no adverse effects.  Home BP readings have not been done prior to visit ,  but  her son has checked it during the visit and reading was 150/82.   She is avoiding added salt in her diet and not walking regularly .  The patient has no signs or symptoms of COVID 19 infection (fever, cough, sore throat  or shortness of breath beyond what is typical for patient).  Patient denies contact with other persons with the above mentioned symptoms or with anyone confirmed to have COVID 19   Had a minor fall two weeks ago by walking at the mall, felt that it was due to a slippery floor.  She sustained a bruise on her leg but denies hip pain    Dementia and GAD:  She has been sleeping better on  75 mg seroquel.  Still misses her own home in [place].  Weight is stable   Up 2 lbs   ROS: Patient denies headache, fevers, malaise, unintentional weight loss, skin rash, eye pain, sinus congestion and sinus pain, sore throat, dysphagia,  hemoptysis , cough, dyspnea, wheezing, chest pain, palpitations, orthopnea, edema, abdominal pain, nausea, melena, diarrhea, constipation, flank pain, dysuria, hematuria, urinary  Frequency, nocturia, numbness, tingling, seizures,  Focal weakness, Loss of consciousness,  Tremor, insomnia, depression, anxiety, and suicidal ideation.     Past Medical History:  Diagnosis Date  . Hypertension   . Small bowel obstruction due to adhesions [facility])     Past Surgical History:  Procedure Laterality Date  . BOWEL RESECTION    . NASAL SEPTUM SURGERY    . TUBAL LIGATION  1969    Family History  Problem Relation Age of Onset  . Cancer Mother   . Heart attack Father   . Alcohol abuse Father   . Alzheimer's disease Sister   . Cerebral aneurysm Brother   . Hypertension Daughter   . Hypertension Son   . Hypertension Daughter     SOCIAL HX:  reports that she has quit smoking. She has never used smokeless tobacco. She reports that she does not drink alcohol or use drugs.   Current Outpatient Medications:  .  acetaminophen (TYLENOL) 500 MG tablet, Take 500 mg by mouth every 6 (six) hours as needed., Disp: , Rfl:  .  amLODipine (NORVASC) 5  MG tablet, TAKE 1 TABLET (5 MG TOTAL) BY MOUTH DAILY., Disp: 90 tablet, Rfl: 1 .  B Complex Vitamins (VITAMIN B COMPLEX PO), Take by mouth., Disp: , Rfl:  .  Cholecalciferol (VITAMIN D3) 100000 UNIT/GM POWD, Take by mouth., Disp: , Rfl:  .  citalopram (CELEXA) 20 MG tablet, Take 20 mg by mouth every morning., Disp: , Rfl:  .  loratadine (CLARITIN) 10 MG tablet, Take 10 mg by mouth daily., Disp: , Rfl:  .  Melatonin 5 MG TABS, Take 1 tablet by mouth at bedtime., Disp: , Rfl:  .  Multiple Vitamin (MULTIVITAMIN) tablet, Take 1 tablet  by mouth daily., Disp: , Rfl:  .  QUEtiapine (SEROQUEL) 25 MG tablet, TAKE 1 TABLET BY MOUTH EVERYDAY AT BEDTIME, Disp: , Rfl:  .  vitamin B-12 (CYANOCOBALAMIN) 1000 MCG tablet, Take 1,000 mcg by mouth daily., Disp: , Rfl:   EXAM:  VITALS per patient if applicable:  GENERAL: alert, oriented, appears well and in no acute distress  HEENT: atraumatic, conjunttiva clear, no obvious abnormalities on inspection of external nose and ears  NECK: normal movements of the head and neck  LUNGS: on inspection no signs of respiratory distress, breathing rate appears normal, no obvious gross SOB, gasping or wheezing  CV: no obvious cyanosis  MS: moves all visible extremities without noticeable abnormality  PSYCH/NEURO: pleasant and cooperative, no obvious depression or anxiety, speech and thought processing grossly intact  ASSESSMENT AND PLAN:  Hypertension Mild elevation today, Recommend she monitor at home and notify office if readings are persistently > 591 systolic after his use of NSAIDs is stopped. Continue amlodipine 5 mg daily    Generalized anxiety disorder Improvement in symptoms noted with change in therapy  To citalopram and Seroquel by Dr [name]  Having the anticipated weight gain with Seroquel,  Walking daily for exercise advised   Frequent falls With  Poor balance and cognitive deficits which are chronic and acknowledged by patient.  Recent fall was minor.     I discussed the assessment and treatment plan with the patient. The patient was provided an opportunity to ask questions and all were answered. The patient agreed with the plan and demonstrated an understanding of the instructions.   The patient was advised to call back or seek an in-person evaluation if the symptoms worsen or if the condition fails to improve as anticipated.  I provided  25 minutes of non-face-to-face time during this encounter.   Crecencio Mc, MD

## 2019-03-01 NOTE — Patient Instructions (Signed)
  The new goals for optimal blood pressure management are 130/80.  Please check your blood pressure a few times at home and send me the readings so I can determine if you need a change in medication   You need to have your blood test doe to recheck your kidney function

## 2019-03-02 ENCOUNTER — Other Ambulatory Visit (INDEPENDENT_AMBULATORY_CARE_PROVIDER_SITE_OTHER): Payer: Medicare Other

## 2019-03-02 DIAGNOSIS — R944 Abnormal results of kidney function studies: Secondary | ICD-10-CM | POA: Diagnosis not present

## 2019-03-02 NOTE — Assessment & Plan Note (Signed)
With  Poor balance and cognitive deficits which are chronic and acknowledged by patient.  Recent fall was minor.

## 2019-03-02 NOTE — Assessment & Plan Note (Signed)
Mild elevation today, Recommend she monitor at home and notify office if readings are persistently > 707 systolic after his use of NSAIDs is stopped. Continue amlodipine 5 mg daily

## 2019-03-02 NOTE — Assessment & Plan Note (Signed)
Improvement in symptoms noted with change in therapy  To citalopram and Seroquel by Dr [name]  Having the anticipated weight gain with Seroquel,  Walking daily for exercise advised

## 2019-03-03 LAB — RENAL FUNCTION PANEL
Albumin: 4.2 g/dL (ref 3.5–5.2)
BUN: 16 mg/dL (ref 6–23)
CO2: 26 mEq/L (ref 19–32)
Calcium: 8.8 mg/dL (ref 8.4–10.5)
Chloride: 103 mEq/L (ref 96–112)
Creatinine, Ser: 0.92 mg/dL (ref 0.40–1.20)
GFR: 59.24 mL/min — ABNORMAL LOW (ref >60.00)
Glucose, Bld: 103 mg/dL — ABNORMAL HIGH (ref 70–99)
Phosphorus: 3.4 mg/dL (ref 2.3–4.6)
Potassium: 4.2 mEq/L (ref 3.5–5.1)
Sodium: 138 mEq/L (ref 135–145)

## 2019-03-30 ENCOUNTER — Other Ambulatory Visit: Payer: Self-pay

## 2019-03-30 ENCOUNTER — Emergency Department
Admission: EM | Admit: 2019-03-30 | Discharge: 2019-03-30 | Disposition: A | Discharge status: Home / Self Care | Payer: No Typology Code available for payment source | Attending: Emergency Medicine | Admitting: Emergency Medicine

## 2019-03-30 ENCOUNTER — Encounter: Payer: Self-pay | Admitting: Emergency Medicine

## 2019-03-30 ENCOUNTER — Emergency Department: Payer: No Typology Code available for payment source

## 2019-03-30 DIAGNOSIS — M25512 Pain in left shoulder: Secondary | ICD-10-CM | POA: Diagnosis not present

## 2019-03-30 DIAGNOSIS — G309 Alzheimer's disease, unspecified: Secondary | ICD-10-CM | POA: Insufficient documentation

## 2019-03-30 DIAGNOSIS — F0280 Dementia in other diseases classified elsewhere without behavioral disturbance: Secondary | ICD-10-CM | POA: Diagnosis not present

## 2019-03-30 DIAGNOSIS — M25511 Pain in right shoulder: Secondary | ICD-10-CM | POA: Diagnosis present

## 2019-03-30 DIAGNOSIS — I10 Essential (primary) hypertension: Secondary | ICD-10-CM | POA: Insufficient documentation

## 2019-03-30 DIAGNOSIS — Z87891 Personal history of nicotine dependence: Secondary | ICD-10-CM | POA: Insufficient documentation

## 2019-03-30 DIAGNOSIS — K5900 Constipation, unspecified: Secondary | ICD-10-CM | POA: Insufficient documentation

## 2019-03-30 DIAGNOSIS — S4992XA Unspecified injury of left shoulder and upper arm, initial encounter: Secondary | ICD-10-CM | POA: Diagnosis not present

## 2019-03-30 MED ORDER — ACETAMINOPHEN 500 MG PO TABS: 500.0000 mg | ORAL_TABLET | Freq: Four times a day (QID) | ORAL | 0 refills | Status: AC | PRN
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
## 2019-04-12 DIAGNOSIS — F0281 Dementia in other diseases classified elsewhere with behavioral disturbance: Secondary | ICD-10-CM | POA: Diagnosis not present

## 2019-04-12 DIAGNOSIS — F39 Unspecified mood [affective] disorder: Secondary | ICD-10-CM | POA: Diagnosis not present

## 2019-04-12 DIAGNOSIS — F5105 Insomnia due to other mental disorder: Secondary | ICD-10-CM | POA: Diagnosis not present

## 2019-08-05 ENCOUNTER — Ambulatory Visit: Payer: Medicare Other

## 2019-08-05 ENCOUNTER — Telehealth: Payer: Self-pay

## 2019-08-05 NOTE — Telephone Encounter (Signed)
Spoke to patient at scheduled time and agreed to call back to complete visit in 1 hour because she was not at home. Called back second time, no answer. Left message to call me back today or reschedule for another day. Please reschedule as appropriate.

## 2019-08-09 DIAGNOSIS — F5105 Insomnia due to other mental disorder: Secondary | ICD-10-CM | POA: Diagnosis not present

## 2019-08-09 DIAGNOSIS — F39 Unspecified mood [affective] disorder: Secondary | ICD-10-CM | POA: Diagnosis not present

## 2019-08-09 DIAGNOSIS — F0281 Dementia in other diseases classified elsewhere with behavioral disturbance: Secondary | ICD-10-CM | POA: Diagnosis not present

## 2019-08-22 ENCOUNTER — Other Ambulatory Visit: Payer: Self-pay | Admitting: Internal Medicine

## 2019-08-22 DIAGNOSIS — N183 Chronic kidney disease, stage 3 unspecified: Secondary | ICD-10-CM

## 2019-08-26 ENCOUNTER — Other Ambulatory Visit: Payer: Self-pay

## 2019-08-26 ENCOUNTER — Ambulatory Visit (INDEPENDENT_AMBULATORY_CARE_PROVIDER_SITE_OTHER): Payer: Medicare Other

## 2019-08-26 ENCOUNTER — Encounter (INDEPENDENT_AMBULATORY_CARE_PROVIDER_SITE_OTHER): Payer: Self-pay

## 2019-08-26 VITALS — Ht 62.0 in | Wt 145.0 lb

## 2019-08-26 DIAGNOSIS — Z0000 Encounter for general adult medical examination without abnormal findings: Secondary | ICD-10-CM

## 2019-08-26 NOTE — Patient Instructions (Addendum)
  Felicia Anderson , Thank you for taking time to come for your Medicare Wellness Visit. I appreciate your ongoing commitment to your health goals. Please review the following plan we discussed and let me know if I can assist you in the future.   These are the goals we discussed: Goals      Patient Stated   . Healthy snacking (pt-stated)     I want to lose a little weight Portion control with meals       This is a list of the screening recommended for you and due dates:  Health Maintenance  Topic Date Due  . Tetanus Vaccine  06/[birth year]/1961  . DEXA scan (bone density measurement)  06/09/2007  . Pneumonia vaccines (2 of 2 - PCV13) 07/22/2016  . Flu Shot  10/20/2019*  *Topic was postponed. The date shown is not the original due date.

## 2019-08-26 NOTE — Progress Notes (Addendum)
Subjective:   Felicia Anderson is a 79 y.o. female who presents for Medicare Annual (Subsequent) preventive examination.  Review of Systems:  No ROS.  Medicare Wellness Virtual Visit.  Visual/audio telehealth visit, UTA vital signs.   Ht/Wt provided. See social history for additional risk factors.   Cardiac Risk Factors include: advanced age (>93men, >92 women)     Objective:     Vitals: Ht 5\' 2"  (1.575 m)   Wt 145 lb (65.8 kg)   BMI 26.52 kg/m   Body mass index is 26.52 kg/m.  Advanced Directives 08/26/2019 03/30/2019 12/23/2017 04/04/2017  Does Patient Have a Medical Advance Directive? No No No No  Would patient like information on creating a medical advance directive? No - Patient declined No - Patient declined No - Patient declined -    Tobacco Social History   Tobacco Use  Smoking Status Former Smoker  Smokeless Tobacco Never Used     Counseling given: Not Answered   Clinical Intake:  Pre-visit preparation completed: Yes           How often do you need to have someone help you when you read instructions, pamphlets, or other written materials from your doctor or pharmacy?: 4 - Often  Interpreter Needed?: No     Past Medical History:  Diagnosis Date  . Hypertension   . Small bowel obstruction due to adhesions [facility])    Past Surgical History:  Procedure Laterality Date  . BOWEL RESECTION    . NASAL SEPTUM SURGERY    . TUBAL LIGATION  1969   Family History  Problem Relation Age of Onset  . Cancer Mother   . Heart attack Father   . Alcohol abuse Father   . Alzheimer's disease Sister   . Cerebral aneurysm Brother   . Hypertension Daughter   . Hypertension Son   . Hypertension Daughter    Social History   Socioeconomic History  . Marital status: Divorced    Spouse name: Not on file  . Number of children: Not on file  . Years of education: Not on file  . Highest education level: Not on file  Occupational History  . Not on file  Tobacco Use  .  Smoking status: Former Research scientist (life sciences)  . Smokeless tobacco: Never Used  Substance and Sexual Activity  . Alcohol use: No  . Drug use: No  . Sexual activity: Not on file  Other Topics Concern  . Not on file  Social History Narrative  . Not on file   Social Determinants of Health   Financial Resource Strain: Low Risk   . Difficulty of Paying Living Expenses: Not hard at all  Food Insecurity: No Food Insecurity  . Worried About Charity fundraiser in the Last Year: Never true  . Ran Out of Food in the Last Year: Never true  Transportation Needs: No Transportation Needs  . Lack of Transportation (Medical): No  . Lack of Transportation (Non-Medical): No  Physical Activity: Sufficiently Active  . Days of Exercise per Week: 7 days  . Minutes of Exercise per Session: 30 min  Stress: No Stress Concern Present  . Feeling of Stress : Not at all  Social Connections: Unknown  . Frequency of Communication with Friends and Family: More than three times a week  . Frequency of Social Gatherings with Friends and Family: More than three times a week  . Attends Religious Services: Not on file  . Active Member of Clubs or Organizations: Not on file  .  Attends Archivist Meetings: Not on file  . Marital Status: Not on file    Outpatient Encounter Medications as of 08/26/2019  Medication Sig  . amLODipine (NORVASC) 5 MG tablet TAKE 1 TABLET (5 MG TOTAL) BY MOUTH DAILY.  . B Complex Vitamins (VITAMIN B COMPLEX PO) Take by mouth.  . Cholecalciferol (VITAMIN D3) 100000 UNIT/GM POWD Take by mouth.  . citalopram (CELEXA) 20 MG tablet Take 20 mg by mouth every morning.  . loratadine (CLARITIN) 10 MG tablet Take 10 mg by mouth daily.  . Melatonin 5 MG TABS Take 1 tablet by mouth at bedtime.  . Multiple Vitamin (MULTIVITAMIN) tablet Take 1 tablet by mouth daily.  . QUEtiapine (SEROQUEL) 25 MG tablet TAKE 1 TABLET BY MOUTH EVERYDAY AT BEDTIME  . vitamin B-12 (CYANOCOBALAMIN) 1000 MCG tablet Take 1,000  mcg by mouth daily.   No facility-administered encounter medications on file as of 08/26/2019.    Activities of Daily Living In your present state of health, do you have any difficulty performing the following activities: 08/26/2019  Hearing? Y  Comment R ear hearing aid  Vision? N  Difficulty concentrating or making decisions? Y  Comment Alzheimer's dementia  Walking or climbing stairs? N  Dressing or bathing? N  Doing errands, shopping? Y  Comment She does not Physiological scientist and eating ? Y  Using the Toilet? N  In the past six months, have you accidently leaked urine? N  Do you have problems with loss of bowel control? N  Managing your Medications? Y  Comment Son and daughter manage  Managing your Finances? Y  Comment Son and daughter manage  Housekeeping or managing your Housekeeping? Y  Comment Son manages  Some recent data might be hidden    Patient Care Team: Crecencio Mc, MD as PCP - General (Internal Medicine)    Assessment:   This is a routine wellness examination for Felicia Anderson.  Nurse connected with patient 08/26/19 at 10:30 AM EST by a telephone enabled telemedicine application and verified that I am speaking with the correct person using two identifiers. Patient stated full name and DOB. Patient gave permission to continue with virtual visit. Patient's location was at home and Nurse's location was at [place] office.   Patient is alert. Patient has Dx Alzheimer's dementia. Patient likes to read for brain stimulation.   Health Maintenance Due: See completed HM at the end of note.   Eye: Visual acuity not assessed. Virtual visit. Followed by their ophthalmologist.  Dental: Dentures- yes  Hearing: Hearing aids- R ear  Safety:  Patient feels safe at home- yes Patient does have smoke detectors at home- yes Patient does wear sunscreen or protective clothing when in direct sunlight - yes Patient does wear seat belt when in a moving vehicle - yes Patient  drives- no Adequate lighting in walkways free from debris- yes Grab bars and handrails used as appropriate- yes Ambulates with an assistive device- no  Cell phone on person when ambulating outside of the home- yes  Social: Alcohol intake - no     Smoking history- former   Smokers in home? none Illicit drug use? none  Medication: Taking as directed and without issues.  Pill box in use -yes  Daughter manages - yes   Covid-19: Precautions and sickness symptoms discussed. Wears mask, social distancing, hand hygiene as appropriate.   Activities of Daily Living Patient denies needing assistance with: feeding themselves, getting from bed to chair, getting to the toilet,  bathing/showering, dressing,  Assisted by son and with household chores, managing money, and preparing meals.   Discussed the importance of a healthy diet, water intake and the benefits of aerobic exercise.  Physical activity- walking daily, 30 min  Diet:  Regular Water: good intake Caffeine: 1-2 cups of coffee  Other Providers Patient Care Team: Crecencio Mc, MD as PCP - General (Internal Medicine) Exercise Activities and Dietary recommendations Current Exercise Habits: Home exercise routine, Type of exercise: walking, Time (Minutes): 30, Frequency (Times/Week): 5, Weekly Exercise (Minutes/Week): 150, Intensity: Mild  Goals      Patient Stated   . Healthy snacking (pt-stated)     I want to lose a little weight Portion control with meals       Fall Risk Fall Risk  08/26/2019 03/01/2019 01/26/2019 12/23/2017 01/10/2017  Falls in the past year? 0 1 0 No Yes  Number falls in past yr: - 0 0 - 1  Injury with Fall? - 0 0 - No  Follow up Falls evaluation completed - Falls evaluation completed - -   Timed Get Up and Go performed: no, virtual visit  Depression Screen PHQ 2/9 Scores 08/26/2019 01/26/2019 12/23/2017 01/10/2017  PHQ - 2 Score 0 0 0 3  PHQ- 9 Score - 0 - 11     Cognitive Function MMSE - Mini Mental  State Exam 08/26/2019 12/23/2017  Not completed: Unable to complete -  Orientation to time - 5  Orientation to Place - 5  Registration - 3  Attention/ Calculation - 5  Recall - 2  Language- name 2 objects - 2  Language- repeat - 1  Language- follow 3 step command - 3  Language- read & follow direction - 1  Write a sentence - 1  Copy design - 1  Total score - 29        Immunization History  Administered Date(s) Administered  . Influenza, High Dose Seasonal PF 08/23/2016, 04/21/2017  . PFIZER SARS-COV-2 Vaccination 08/19/2019  . Pneumococcal-Unspecified 07/23/2015   Screening Tests Health Maintenance  Topic Date Due  . TETANUS/TDAP  06/08/1961  . DEXA SCAN  06/09/2007  . PNA vac Low Risk Adult (2 of 2 - PCV13) 07/22/2016  . INFLUENZA VACCINE  10/20/2019 (Originally 02/20/2019)      Plan:   Keep all routine maintenance appointments.   Follow up 10/05/19 @ 3:30  Medicare Attestation I have personally reviewed: The patient's medical and social history Their use of alcohol, tobacco or illicit drugs Their current medications and supplements The patient's functional ability including ADLs,fall risks, home safety risks, cognitive, and hearing and visual impairment Diet and physical activities Evidence for depression   I have reviewed and discussed with patient certain preventive protocols, quality metrics, and best practice recommendations.     Varney Biles, LPN  [id number]

## 2019-10-01 ENCOUNTER — Other Ambulatory Visit: Payer: Self-pay

## 2019-10-05 ENCOUNTER — Encounter: Payer: Self-pay | Admitting: Internal Medicine

## 2019-10-05 ENCOUNTER — Ambulatory Visit (INDEPENDENT_AMBULATORY_CARE_PROVIDER_SITE_OTHER): Payer: Medicare Other | Admitting: Internal Medicine

## 2019-10-05 ENCOUNTER — Other Ambulatory Visit: Payer: Self-pay

## 2019-10-05 VITALS — BP 140/72 | HR 72 | Temp 95.7°F | Resp 16 | Ht 62.0 in | Wt 163.0 lb

## 2019-10-05 DIAGNOSIS — G301 Alzheimer's disease with late onset: Secondary | ICD-10-CM

## 2019-10-05 DIAGNOSIS — I10 Essential (primary) hypertension: Secondary | ICD-10-CM

## 2019-10-05 DIAGNOSIS — R5383 Other fatigue: Secondary | ICD-10-CM

## 2019-10-05 DIAGNOSIS — F411 Generalized anxiety disorder: Secondary | ICD-10-CM

## 2019-10-05 DIAGNOSIS — F0280 Dementia in other diseases classified elsewhere without behavioral disturbance: Secondary | ICD-10-CM | POA: Diagnosis not present

## 2019-10-05 MED ORDER — ALPRAZOLAM 0.25 MG PO TABS: 0.1250 mg | ORAL_TABLET | Freq: Two times a day (BID) | ORAL | 1 refills | Status: DC | PRN

## 2019-10-05 NOTE — Assessment & Plan Note (Addendum)
She refuses to see Dr [name] anymore.  Adding prn alprazolam to manage daytime anxiety .
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
## 2019-10-05 NOTE — Patient Instructions (Signed)
I am prescribing alprazolam to use for severe anxiety  1/2 tablet AS NEEDED (Not more than twice daily)   Felicia Anderson needs A JOB TO DO EVERY DAY TO HELP HER FEEL LIKE SHE IS NOT A BURDEN  PATIENCE AND CLARITY ARE KEY

## 2019-10-06 LAB — CBC WITH DIFFERENTIAL/PLATELET
Basophils Absolute: 0 10*3/uL (ref 0.0–0.1)
Basophils Relative: 0.4 % (ref 0.0–3.0)
Eosinophils Absolute: 0 10*3/uL (ref 0.0–0.7)
Eosinophils Relative: 0.1 % (ref 0.0–5.0)
HCT: 38.4 % (ref 36.0–46.0)
Hemoglobin: 12.6 g/dL (ref 12.0–15.0)
Lymphocytes Relative: 17.9 % (ref 12.0–46.0)
Lymphs Abs: 1.8 10*3/uL (ref 0.7–4.0)
MCHC: 32.9 g/dL (ref 30.0–36.0)
MCV: 91 fl (ref 78.0–100.0)
Monocytes Absolute: 0.7 10*3/uL (ref 0.1–1.0)
Monocytes Relative: 7.1 % (ref 3.0–12.0)
Neutro Abs: 7.3 10*3/uL (ref 1.4–7.7)
Neutrophils Relative %: 74.5 % (ref 43.0–77.0)
Platelets: 308 10*3/uL (ref 150.0–400.0)
RBC: 4.22 Mil/uL (ref 3.87–5.11)
RDW: 14.4 % (ref 11.5–15.5)
WBC: 9.8 10*3/uL (ref 4.0–10.5)

## 2019-10-06 LAB — COMPREHENSIVE METABOLIC PANEL
ALT: 23 U/L (ref 0–35)
AST: 25 U/L (ref 0–37)
Albumin: 3.9 g/dL (ref 3.5–5.2)
Alkaline Phosphatase: 95 U/L (ref 39–117)
BUN: 15 mg/dL (ref 6–23)
CO2: 30 mEq/L (ref 19–32)
Calcium: 8.7 mg/dL (ref 8.4–10.5)
Chloride: 104 mEq/L (ref 96–112)
Creatinine, Ser: 0.92 mg/dL (ref 0.40–1.20)
GFR: 59.14 mL/min — ABNORMAL LOW (ref >60.00)
Glucose, Bld: 124 mg/dL — ABNORMAL HIGH (ref 70–99)
Potassium: 4 mEq/L (ref 3.5–5.1)
Sodium: 140 mEq/L (ref 135–145)
Total Bilirubin: 0.6 mg/dL (ref 0.2–1.2)
Total Protein: 7.1 g/dL (ref 6.0–8.3)

## 2019-10-06 LAB — TSH: TSH: 1.78 u[IU]/mL (ref 0.35–4.50)

## 2019-10-07 NOTE — Assessment & Plan Note (Signed)
Well controlled on current regimen. Renal function stable, no changes today. 
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
## 2019-11-16 DIAGNOSIS — F39 Unspecified mood [affective] disorder: Secondary | ICD-10-CM | POA: Diagnosis not present

## 2019-11-16 DIAGNOSIS — F0281 Dementia in other diseases classified elsewhere with behavioral disturbance: Secondary | ICD-10-CM | POA: Diagnosis not present

## 2019-11-16 DIAGNOSIS — F5105 Insomnia due to other mental disorder: Secondary | ICD-10-CM | POA: Diagnosis not present

## 2019-12-15 DIAGNOSIS — F0281 Dementia in other diseases classified elsewhere with behavioral disturbance: Secondary | ICD-10-CM | POA: Diagnosis not present

## 2019-12-15 DIAGNOSIS — F5105 Insomnia due to other mental disorder: Secondary | ICD-10-CM | POA: Diagnosis not present

## 2019-12-15 DIAGNOSIS — F39 Unspecified mood [affective] disorder: Secondary | ICD-10-CM | POA: Diagnosis not present

## 2020-01-05 ENCOUNTER — Ambulatory Visit (INDEPENDENT_AMBULATORY_CARE_PROVIDER_SITE_OTHER): Payer: Medicare Other

## 2020-01-05 ENCOUNTER — Encounter: Payer: Self-pay | Admitting: Internal Medicine

## 2020-01-05 ENCOUNTER — Other Ambulatory Visit: Payer: Self-pay

## 2020-01-05 ENCOUNTER — Ambulatory Visit (INDEPENDENT_AMBULATORY_CARE_PROVIDER_SITE_OTHER): Payer: Medicare Other | Admitting: Internal Medicine

## 2020-01-05 VITALS — BP 144/76 | HR 67 | Temp 96.7°F | Resp 15 | Ht 62.0 in | Wt 157.2 lb

## 2020-01-05 DIAGNOSIS — G301 Alzheimer's disease with late onset: Secondary | ICD-10-CM | POA: Diagnosis not present

## 2020-01-05 DIAGNOSIS — F0280 Dementia in other diseases classified elsewhere without behavioral disturbance: Secondary | ICD-10-CM

## 2020-01-05 DIAGNOSIS — L603 Nail dystrophy: Secondary | ICD-10-CM | POA: Diagnosis not present

## 2020-01-05 DIAGNOSIS — R1031 Right lower quadrant pain: Secondary | ICD-10-CM

## 2020-01-05 DIAGNOSIS — R109 Unspecified abdominal pain: Secondary | ICD-10-CM | POA: Diagnosis not present
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
## 2020-01-05 NOTE — Patient Instructions (Signed)
Referral to Podiatry for the toenail issue  The abdominal pain is a mystery.  X rays and urinalysis to day to rule out constipation and UTI   If negative,  We'll need to gather more information  Please make some notes about the abdominal pain she is having:  1) does it get better after a BM?  After walking?  After urinating?  2) how often is it occurring?

## 2020-01-06 LAB — URINE CULTURE
MICRO NUMBER:: 10598486
Result:: NO GROWTH
SPECIMEN QUALITY:: ADEQUATE

## 2020-01-06 LAB — URINALYSIS, ROUTINE W REFLEX MICROSCOPIC
Bilirubin Urine: NEGATIVE
Hgb urine dipstick: NEGATIVE
Ketones, ur: NEGATIVE
Leukocytes,Ua: NEGATIVE
Nitrite: NEGATIVE
RBC / HPF: NONE SEEN (ref 0–?)
Specific Gravity, Urine: 1.015 (ref 1.000–1.030)
Total Protein, Urine: NEGATIVE
Urine Glucose: NEGATIVE
Urobilinogen, UA: 0.2 (ref 0.0–1.0)
pH: 8 (ref 5.0–8.0)

## 2020-01-07 DIAGNOSIS — R1031 Right lower quadrant pain: Secondary | ICD-10-CM | POA: Insufficient documentation

## 2020-01-07 NOTE — Assessment & Plan Note (Signed)
She is not competent to live alone.  She is living with daughter [name] and Son in law who is with her at home.

## 2020-01-07 NOTE — Progress Notes (Signed)
There is no evidence of UTI by culture results.  Her x rays suggest that the abdominal pain she has been having intermittently is due to constipation .  If she can tolerate use of Dulcolax or Ex Lax,  I would recommend nightly use of one of these stimulant laxatives. For a few days to get things moving agAIN

## 2020-01-07 NOTE — Assessment & Plan Note (Addendum)
Etiology likely related to her chronic constipation. Plain films show gas and stool throughout the colon, with air fluid levels suggestive of ileus .  She has no signs of SBO.

## 2020-01-08 NOTE — Progress Notes (Signed)
Previous results message appears to have  been lost .  X ray suggests that her abdominal pain is due to bowel distension from a "stalled colon."  I  recommend use of dulcolax nightly to get bowels moving  Regards,   Deborra Medina, MD

## 2020-02-17 ENCOUNTER — Other Ambulatory Visit: Payer: Self-pay | Admitting: Internal Medicine

## 2020-02-17 DIAGNOSIS — N183 Chronic kidney disease, stage 3 unspecified: Secondary | ICD-10-CM

## 2020-04-11 DIAGNOSIS — F5105 Insomnia due to other mental disorder: Secondary | ICD-10-CM | POA: Diagnosis not present

## 2020-04-11 DIAGNOSIS — F0281 Dementia in other diseases classified elsewhere with behavioral disturbance: Secondary | ICD-10-CM | POA: Diagnosis not present

## 2020-04-11 DIAGNOSIS — F39 Unspecified mood [affective] disorder: Secondary | ICD-10-CM | POA: Diagnosis not present

## 2020-04-19 DIAGNOSIS — Z23 Encounter for immunization: Secondary | ICD-10-CM | POA: Diagnosis not present

## 2020-04-27 DIAGNOSIS — Z23 Encounter for immunization: Secondary | ICD-10-CM | POA: Diagnosis not present

## 2020-07-06 ENCOUNTER — Encounter: Payer: Self-pay | Admitting: Internal Medicine

## 2020-07-06 ENCOUNTER — Other Ambulatory Visit: Payer: Self-pay

## 2020-07-06 ENCOUNTER — Ambulatory Visit (INDEPENDENT_AMBULATORY_CARE_PROVIDER_SITE_OTHER): Payer: Medicare Other | Admitting: Internal Medicine

## 2020-07-06 VITALS — BP 122/62 | HR 77 | Temp 98.2°F | Ht 62.0 in | Wt 160.0 lb

## 2020-07-06 DIAGNOSIS — Z23 Encounter for immunization: Secondary | ICD-10-CM | POA: Diagnosis not present

## 2020-07-06 DIAGNOSIS — R7301 Impaired fasting glucose: Secondary | ICD-10-CM | POA: Diagnosis not present

## 2020-07-06 DIAGNOSIS — I10 Essential (primary) hypertension: Secondary | ICD-10-CM | POA: Diagnosis not present

## 2020-07-06 DIAGNOSIS — G301 Alzheimer's disease with late onset: Secondary | ICD-10-CM | POA: Diagnosis not present

## 2020-07-06 DIAGNOSIS — E559 Vitamin D deficiency, unspecified: Secondary | ICD-10-CM | POA: Diagnosis not present

## 2020-07-06 DIAGNOSIS — R635 Abnormal weight gain: Secondary | ICD-10-CM

## 2020-07-06 DIAGNOSIS — F411 Generalized anxiety disorder: Secondary | ICD-10-CM | POA: Diagnosis not present

## 2020-07-06 DIAGNOSIS — F0280 Dementia in other diseases classified elsewhere without behavioral disturbance: Secondary | ICD-10-CM

## 2020-07-06 LAB — COMPREHENSIVE METABOLIC PANEL
ALT: 22 U/L (ref 0–35)
AST: 22 U/L (ref 0–37)
Albumin: 3.9 g/dL (ref 3.5–5.2)
Alkaline Phosphatase: 88 U/L (ref 39–117)
BUN: 14 mg/dL (ref 6–23)
CO2: 29 mEq/L (ref 19–32)
Calcium: 8.1 mg/dL — ABNORMAL LOW (ref 8.4–10.5)
Chloride: 104 mEq/L (ref 96–112)
Creatinine, Ser: 0.89 mg/dL (ref 0.40–1.20)
GFR: 62.25 mL/min (ref >60.00)
Glucose, Bld: 124 mg/dL — ABNORMAL HIGH (ref 70–99)
Potassium: 4.3 mEq/L (ref 3.5–5.1)
Sodium: 141 mEq/L (ref 135–145)
Total Bilirubin: 0.7 mg/dL (ref 0.2–1.2)
Total Protein: 7 g/dL (ref 6.0–8.3)

## 2020-07-06 LAB — LIPID PANEL
Cholesterol: 103 mg/dL (ref 0–200)
HDL: 43.7 mg/dL (ref >39.00)
LDL Cholesterol: 33 mg/dL (ref 0–99)
NonHDL: 59.65
Total CHOL/HDL Ratio: 2
Triglycerides: 132 mg/dL (ref 0.0–149.0)
VLDL: 26.4 mg/dL (ref 0.0–40.0)

## 2020-07-06 LAB — HEMOGLOBIN A1C: Hgb A1c MFr Bld: 5.9 % (ref 4.6–6.5)

## 2020-07-06 LAB — VITAMIN D 25 HYDROXY (VIT D DEFICIENCY, FRACTURES): VITD: 76.89 ng/mL (ref 30.00–100.00)
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
## 2020-07-06 NOTE — Patient Instructions (Addendum)
You are doing well!  But you need to lose a little weight!  Stick with Healthy Choice,  [employer] Cuisine  Meals   Limit  [employer],  Ban[employer] or [employer]  (too  Many calories )   Walk more , work your way up to 30 minutes   Peanut butter and crackers  ,, Cuties (tangerines)  Grapes,  Applesauce make better low calorie snacks

## 2020-07-07 ENCOUNTER — Encounter: Payer: Self-pay | Admitting: Internal Medicine

## 2020-07-07 NOTE — Assessment & Plan Note (Addendum)
etiology unclear.   Vitamin D is on the high side of normal.  Advised to stop any D supplements and return for additional labs to rule out low magnesium,  Parathyroid issues

## 2020-07-07 NOTE — Progress Notes (Signed)
Labs reviewed:  her calcium level is low and her vitamin D level is on the high side.  Needs to stop any vitamin D supplements (find out how much she was taking) and return for additional labs to rule out an overactive parathyroid hormone. Please schedule a lab appt for her

## 2020-07-09 DIAGNOSIS — R635 Abnormal weight gain: Secondary | ICD-10-CM | POA: Insufficient documentation

## 2020-07-09 NOTE — Assessment & Plan Note (Signed)
Well controlled on current regimen of amlodipine 5 mg daily. Marland Kitchen

## 2020-07-09 NOTE — Assessment & Plan Note (Signed)
Continue aricept .  It is unclear if she is taking Seroquel

## 2020-07-09 NOTE — Assessment & Plan Note (Signed)
She is no longer seeing  Dr [name] anymore.  She has not refilled the  Alprazolam since March.  The dementia (or treatment of it) has apparently resulted in decreased daytime anxiety .  Continue citalopram.

## 2020-07-09 NOTE — Assessment & Plan Note (Signed)
I have addressed  BMI and recommended a low glycemic index diet utilizing smaller more frequent meals to increase metabolism.  I have also recommended that patient increase her walking with a goal of 30 minutes a minimum of 5 days per week.

## 2020-07-24 ENCOUNTER — Other Ambulatory Visit: Payer: Medicare Other

## 2020-07-31 ENCOUNTER — Other Ambulatory Visit (INDEPENDENT_AMBULATORY_CARE_PROVIDER_SITE_OTHER): Payer: Medicare Other

## 2020-07-31 ENCOUNTER — Other Ambulatory Visit: Payer: Self-pay

## 2020-07-31 LAB — MAGNESIUM: Magnesium: 2 mg/dL (ref 1.5–2.5)

## 2020-08-02 ENCOUNTER — Encounter: Payer: Self-pay | Admitting: Internal Medicine

## 2020-08-02 DIAGNOSIS — E210 Primary hyperparathyroidism: Secondary | ICD-10-CM | POA: Insufficient documentation

## 2020-08-02 LAB — PTH, INTACT AND CALCIUM
Calcium: 8.6 mg/dL (ref 8.6–10.4)
PTH: 125 pg/mL — ABNORMAL HIGH (ref 14–64)

## 2020-08-02 NOTE — Progress Notes (Signed)
Her repeat calcium level is normal.. 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  Do NOT resume vitamin d supplementation   Regards,   Deborra Medina, MD

## 2020-08-03 ENCOUNTER — Telehealth: Payer: Self-pay

## 2020-08-03 NOTE — Telephone Encounter (Signed)
Left message to call back for lab results.

## 2020-08-16 DIAGNOSIS — F5105 Insomnia due to other mental disorder: Secondary | ICD-10-CM | POA: Diagnosis not present

## 2020-08-16 DIAGNOSIS — F0281 Dementia in other diseases classified elsewhere with behavioral disturbance: Secondary | ICD-10-CM | POA: Diagnosis not present

## 2020-08-16 DIAGNOSIS — F39 Unspecified mood [affective] disorder: Secondary | ICD-10-CM | POA: Diagnosis not present

## 2020-08-17 DIAGNOSIS — F5105 Insomnia due to other mental disorder: Secondary | ICD-10-CM | POA: Diagnosis not present

## 2020-08-17 DIAGNOSIS — F39 Unspecified mood [affective] disorder: Secondary | ICD-10-CM | POA: Diagnosis not present

## 2020-08-28 ENCOUNTER — Telehealth: Payer: Self-pay | Admitting: Internal Medicine

## 2020-08-28 ENCOUNTER — Telehealth: Payer: Self-pay

## 2020-08-28 ENCOUNTER — Ambulatory Visit: Payer: Medicare Other

## 2020-08-28 DIAGNOSIS — S62325A Displaced fracture of shaft of fourth metacarpal bone, left hand, initial encounter for closed fracture: Secondary | ICD-10-CM | POA: Diagnosis not present

## 2020-08-28 DIAGNOSIS — S62353A Nondisplaced fracture of shaft of third metacarpal bone, left hand, initial encounter for closed fracture: Secondary | ICD-10-CM | POA: Diagnosis not present
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
## 2020-08-28 NOTE — Telephone Encounter (Signed)
Unsuccessful attempt to reach patient for scheduled awv. No answer. Left message to reschedule.  

## 2020-08-30 ENCOUNTER — Other Ambulatory Visit: Payer: Self-pay | Admitting: Internal Medicine

## 2020-08-30 DIAGNOSIS — N183 Chronic kidney disease, stage 3 unspecified: Secondary | ICD-10-CM

## 2020-09-01 ENCOUNTER — Ambulatory Visit (INDEPENDENT_AMBULATORY_CARE_PROVIDER_SITE_OTHER): Payer: Medicare Other

## 2020-09-01 VITALS — Ht 62.0 in | Wt 160.0 lb

## 2020-09-01 DIAGNOSIS — Z0000 Encounter for general adult medical examination without abnormal findings: Secondary | ICD-10-CM

## 2020-09-01 NOTE — Patient Instructions (Addendum)
Felicia Anderson , Thank you for taking time to come for your Medicare Wellness Visit. I appreciate your ongoing commitment to your health goals. Please review the following plan we discussed and let me know if I can assist you in the future.   These are the goals we discussed: Goals      Patient Stated   .  Healthy snacking (pt-stated)      I want to lose a little weight Portion control with meals       This is a list of the screening recommended for you and due dates:  Health Maintenance  Topic Date Due  . COVID-19 Vaccine (3 - Booster for Pfizer series) 03/08/2020  . DEXA scan (bone density measurement)  09/01/2021*  . Tetanus Vaccine  09/01/2021*  .  Hepatitis C: One time screening is recommended by Center for Disease Control  (CDC) for  adults born from [birth year] through 1964.   09/01/2021*  . Flu Shot  Completed  . Pneumonia vaccines  Completed  *Topic was postponed. The date shown is not the original due date.   Immunizations Immunization History  Administered Date(s) Administered  . Fluad Quad(high Dose 65+) 07/06/2020  . Influenza, High Dose Seasonal PF 08/23/2016, 04/21/2017  . PFIZER(Purple Top)SARS-COV-2 Vaccination 08/19/2019, 09/09/2019  . Pneumococcal Conjugate-13 07/06/2020  . Pneumococcal-Unspecified 07/23/2015   Advanced directives: not yet completed.  Conditions/risks identified: none new.  Follow up in one year for your annual wellness visit.   Preventive Care 68 Years and Older, Female Preventive care refers to lifestyle choices and visits with your health care provider that can promote health and wellness. What does preventive care include?  A yearly physical exam. This is also called an annual well check.  Dental exams once or twice a year.  Routine eye exams. Ask your health care provider how often you should have your eyes checked.  Personal lifestyle choices, including:  Daily care of your teeth and gums.  Regular physical activity.  Eating a  healthy diet.  Avoiding tobacco and drug use.  Limiting alcohol use.  Practicing safe sex.  Taking low-dose aspirin every day.  Taking vitamin and mineral supplements as recommended by your health care provider. What happens during an annual well check? The services and screenings done by your health care provider during your annual well check will depend on your age, overall health, lifestyle risk factors, and family history of disease. Counseling  Your health care provider may ask you questions about your:  Alcohol use.  Tobacco use.  Drug use.  Emotional well-being.  Home and relationship well-being.  Sexual activity.  Eating habits.  History of falls.  Memory and ability to understand (cognition).  Work and work Statistician.  Reproductive health. Screening  You may have the following tests or measurements:  Height, weight, and BMI.  Blood pressure.  Lipid and cholesterol levels. These may be checked every 5 years, or more frequently if you are over 20 years old.  Skin check.  Lung cancer screening. You may have this screening every year starting at age 56 if you have a 30-pack-year history of smoking and currently smoke or have quit within the past 15 years.  Fecal occult blood test (FOBT) of the stool. You may have this test every year starting at age 16.  Flexible sigmoidoscopy or colonoscopy. You may have a sigmoidoscopy every 5 years or a colonoscopy every 10 years starting at age 83.  Hepatitis C blood test.  Hepatitis B blood test.  Sexually transmitted disease (STD) testing.  Diabetes screening. This is done by checking your blood sugar (glucose) after you have not eaten for a while (fasting). You may have this done every 1-3 years.  Bone density scan. This is done to screen for osteoporosis. You may have this done starting at age 66.  Mammogram. This may be done every 1-2 years. Talk to your health care provider about how often you should  have regular mammograms. Talk with your health care provider about your test results, treatment options, and if necessary, the need for more tests. Vaccines  Your health care provider may recommend certain vaccines, such as:  Influenza vaccine. This is recommended every year.  Tetanus, diphtheria, and acellular pertussis (Tdap, Td) vaccine. You may need a Td booster every 10 years.  Zoster vaccine. You may need this after age 56.  Pneumococcal 13-valent conjugate (PCV13) vaccine. One dose is recommended after age 44.  Pneumococcal polysaccharide (PPSV23) vaccine. One dose is recommended after age 79. Talk to your health care provider about which screenings and vaccines you need and how often you need them. This information is not intended to replace advice given to you by your health care provider. Make sure you discuss any questions you have with your health care provider. Document Released: 08/04/2015 Document Revised: 03/27/2016 Document Reviewed: 05/09/2015 Elsevier Interactive Patient Education  2017 [place] Prevention in the Home Falls can cause injuries. They can happen to people of all ages. There are many things you can do to make your home safe and to help prevent falls. What can I do on the outside of my home?  Regularly fix the edges of walkways and driveways and fix any cracks.  Remove anything that might make you trip as you walk through a door, such as a raised step or threshold.  Trim any bushes or trees on the path to your home.  Use bright outdoor lighting.  Clear any walking paths of anything that might make someone trip, such as rocks or tools.  Regularly check to see if handrails are loose or broken. Make sure that both sides of any steps have handrails.  Any raised decks and porches should have guardrails on the edges.  Have any leaves, snow, or ice cleared regularly.  Use sand or salt on walking paths during winter.  Clean up any spills in  your garage right away. This includes oil or grease spills. What can I do in the bathroom?  Use night lights.  Install grab bars by the toilet and in the tub and shower. Do not use towel bars as grab bars.  Use non-skid mats or decals in the tub or shower.  If you need to sit down in the shower, use a plastic, non-slip stool.  Keep the floor dry. Clean up any water that spills on the floor as soon as it happens.  Remove soap buildup in the tub or shower regularly.  Attach bath mats securely with double-sided non-slip rug tape.  Do not have throw rugs and other things on the floor that can make you trip. What can I do in the bedroom?  Use night lights.  Make sure that you have a light by your bed that is easy to reach.  Do not use any sheets or blankets that are too big for your bed. They should not hang down onto the floor.  Have a firm chair that has side arms. You can use this for support while you get  dressed.  Do not have throw rugs and other things on the floor that can make you trip. What can I do in the kitchen?  Clean up any spills right away.  Avoid walking on wet floors.  Keep items that you use a lot in easy-to-reach places.  If you need to reach something above you, use a strong step stool that has a grab bar.  Keep electrical cords out of the way.  Do not use floor polish or wax that makes floors slippery. If you must use wax, use non-skid floor wax.  Do not have throw rugs and other things on the floor that can make you trip. What can I do with my stairs?  Do not leave any items on the stairs.  Make sure that there are handrails on both sides of the stairs and use them. Fix handrails that are broken or loose. Make sure that handrails are as long as the stairways.  Check any carpeting to make sure that it is firmly attached to the stairs. Fix any carpet that is loose or worn.  Avoid having throw rugs at the top or bottom of the stairs. If you do have  throw rugs, attach them to the floor with carpet tape.  Make sure that you have a light switch at the top of the stairs and the bottom of the stairs. If you do not have them, ask someone to add them for you. What else can I do to help prevent falls?  Wear shoes that:  Do not have high heels.  Have rubber bottoms.  Are comfortable and fit you well.  Are closed at the toe. Do not wear sandals.  If you use a stepladder:  Make sure that it is fully opened. Do not climb a closed stepladder.  Make sure that both sides of the stepladder are locked into place.  Ask someone to hold it for you, if possible.  Clearly mark and make sure that you can see:  Any grab bars or handrails.  First and last steps.  Where the edge of each step is.  Use tools that help you move around (mobility aids) if they are needed. These include:  Canes.  Walkers.  Scooters.  Crutches.  Turn on the lights when you go into a dark area. Replace any light bulbs as soon as they burn out.  Set up your furniture so you have a clear path. Avoid moving your furniture around.  If any of your floors are uneven, fix them.  If there are any pets around you, be aware of where they are.  Review your medicines with your doctor. Some medicines can make you feel dizzy. This can increase your chance of falling. Ask your doctor what other things that you can do to help prevent falls. This information is not intended to replace advice given to you by your health care provider. Make sure you discuss any questions you have with your health care provider. Document Released: 05/04/2009 Document Revised: 12/14/2015 Document Reviewed: 08/12/2014 Elsevier Interactive Patient Education  2017 [employer].

## 2020-09-01 NOTE — Progress Notes (Addendum)
Subjective:   Felicia Anderson is a 80 y.o. female who presents for Medicare Annual (Subsequent) preventive examination.  Review of Systems    No ROS.  Medicare Wellness Virtual Visit.    Cardiac Risk Factors include: advanced age (>37men, >91 women);hypertension     Objective:    Today's Vitals   09/01/20 1335  Weight: 160 lb (72.6 kg)  Height: 5\' 2"  (1.575 m)   Body mass index is 29.26 kg/m.  Advanced Directives 08/26/2019 03/30/2019 12/23/2017 04/04/2017  Does Patient Have a Medical Advance Directive? No No No No  Would patient like information on creating a medical advance directive? No - Patient declined No - Patient declined No - Patient declined -    Current Medications (verified) Outpatient Encounter Medications as of 09/01/2020  Medication Sig   ALPRAZolam (XANAX) 0.25 MG tablet Take 0.5 tablets (0.125 mg total) by mouth 2 (two) times daily as needed for anxiety.   amLODipine (NORVASC) 5 MG tablet TAKE 1 TABLET (5 MG TOTAL) BY MOUTH DAILY.   B Complex Vitamins (VITAMIN B COMPLEX PO) Take by mouth.   Cholecalciferol (VITAMIN D3) 100000 UNIT/GM POWD Take by mouth.   citalopram (CELEXA) 20 MG tablet Take 20 mg by mouth every morning.   donepezil (ARICEPT) 10 MG tablet Take 10 mg by mouth daily.   loratadine (CLARITIN) 10 MG tablet Take 10 mg by mouth daily.   Melatonin 5 MG TABS Take 1 tablet by mouth at bedtime.   Multiple Vitamin (MULTIVITAMIN) tablet Take 1 tablet by mouth daily.   QUEtiapine (SEROQUEL) 25 MG tablet 75 mg.    QUEtiapine (SEROQUEL) 50 MG tablet SMARTSIG:3 Tablet(s) By Mouth Every Other Day PRN   vitamin B-12 (CYANOCOBALAMIN) 1000 MCG tablet Take 1,000 mcg by mouth daily.   No facility-administered encounter medications on file as of 09/01/2020.    Allergies (verified) Morphine and related   History: Past Medical History:  Diagnosis Date   Hypertension    Small bowel obstruction due to adhesions [facility])    Past Surgical History:  Procedure Laterality  Date   BOWEL RESECTION     NASAL SEPTUM SURGERY     TUBAL LIGATION  1969   Family History  Problem Relation Age of Onset   Cancer Mother    Heart attack Father    Alcohol abuse Father    Alzheimer's disease Sister    Cerebral aneurysm Brother    Hypertension Daughter    Hypertension Son    Hypertension Daughter    Social History   Socioeconomic History   Marital status: Divorced    Spouse name: Not on file   Number of children: Not on file   Years of education: Not on file   Highest education level: Not on file  Occupational History   Not on file  Tobacco Use   Smoking status: Former Smoker   Smokeless tobacco: Never Used  Scientific laboratory technician Use: Never used  Substance and Sexual Activity   Alcohol use: No   Drug use: No   Sexual activity: Not on file  Other Topics Concern   Not on file  Social History Narrative   Not on file   Social Determinants of Health   Financial Resource Strain: Not on file  Food Insecurity: Not on file  Transportation Needs: Not on file  Physical Activity: Not on file  Stress: Not on file  Social Connections: Not on file    Tobacco Counseling Counseling given: Not Answered   Clinical  Intake:  Pre-visit preparation completed: Yes        Diabetes: No  How often do you need to have someone help you when you read instructions, pamphlets, or other written materials from your doctor or pharmacy?: 1 - Never   Interpreter Needed?: No      Activities of Daily Living In your present state of health, do you have any difficulty performing the following activities: 09/01/2020  Hearing? Y  Vision? N  Difficulty concentrating or making decisions? Y  Walking or climbing stairs? N  Dressing or bathing? N  Doing errands, shopping? Y  Preparing Food and eating ? N  Using the Toilet? N  In the past six months, have you accidently leaked urine? N  Do you have problems with loss of bowel control? N  Managing your Medications? Y   Managing your Finances? Y  Housekeeping or managing your Housekeeping? N  Some recent data might be hidden    Patient Care Team: Crecencio Mc, MD as PCP - General (Internal Medicine)  Indicate any recent Medical Services you may have received from other than [facility] providers in the past year (date may be approximate).     Assessment:   This is a routine wellness examination for Felicia Anderson.  I connected with Felicia Anderson today by telephone and verified that I am speaking with the correct person using two identifiers. Location patient: home Location provider: work Persons participating in the virtual visit: patient, Marine scientist.    I discussed the limitations, risks, security and privacy concerns of performing an evaluation and management service by telephone and the availability of in person appointments. The patient expressed understanding and verbally consented to this telephonic visit.    Interactive audio and video telecommunications were attempted between this provider and patient, however failed, due to patient having technical difficulties OR patient did not have access to video capability.  We continued and completed visit with audio only.  Some vital signs may be absent or patient reported.   Hearing/Vision screen  Hearing Screening   125Hz  250Hz  500Hz  1000Hz  2000Hz  3000Hz  4000Hz  6000Hz  8000Hz   Right ear:           Left ear:           Comments: L ear hearing aid  Vision Screening Comments: Followed by [facility]  Wears corrective lenses  Visual acuity not assessed per patient preference   Dietary issues and exercise activities discussed: Current Exercise Habits: Home exercise routine, Type of exercise: walking, Intensity: Mild  Healthy diet Good water intake   Goals       Patient Stated     Healthy snacking (pt-stated)      I want to lose a little weight Portion control with meals       Depression Screen PHQ 2/9 Scores 09/01/2020 08/26/2019 01/26/2019 12/23/2017  01/10/2017  PHQ - 2 Score 0 0 0 0 3  PHQ- 9 Score - - 0 - 11    Fall Risk Fall Risk  09/01/2020 01/05/2020 10/05/2019 08/26/2019 03/01/2019  Falls in the past year? 1 0 0 0 1  Number falls in past yr: 0 - - - 0  Injury with Fall? 1 - - - 0  Follow up Falls evaluation completed Falls evaluation completed Falls evaluation completed Falls evaluation completed -    FALL RISK PREVENTION PERTAINING TO THE HOME: Handrails in use when climbing stairs? Yes Home free of loose throw rugs in walkways, pet beds, electrical cords, etc? Yes  Adequate lighting  in your home to reduce risk of falls? Yes   ASSISTIVE DEVICES UTILIZED TO PREVENT FALLS: Life alert? No   Use of a cane, walker or w/c? No   TIMED UP AND GO: Was the test performed? No . Virtual visit.   Cognitive Function: MMSE - Mini Mental State Exam 08/26/2019 12/23/2017  Not completed: Unable to complete -  Orientation to time - 5  Orientation to Place - 5  Registration - 3  Attention/ Calculation - 5  Recall - 2  Language- name 2 objects - 2  Language- repeat - 1  Language- follow 3 step command - 3  Language- read & follow direction - 1  Write a sentence - 1  Copy design - 1  Total score - 29        Immunizations Immunization History  Administered Date(s) Administered   Fluad Quad(high Dose 65+) 07/06/2020   Influenza, High Dose Seasonal PF 08/23/2016, 04/21/2017   PFIZER(Purple Top)SARS-COV-2 Vaccination 08/19/2019, 09/09/2019   Pneumococcal Conjugate-13 07/06/2020   Pneumococcal-Unspecified 07/23/2015    Health Maintenance Health Maintenance  Topic Date Due   COVID-19 Vaccine (3 - Booster for [place] series) 03/08/2020   DEXA SCAN  09/01/2021 (Originally 06/09/2007)   TETANUS/TDAP  09/01/2021 (Originally 06/08/1960)   Hepatitis C Screening  09/01/2021 (Originally May 25, 1941)   INFLUENZA VACCINE  Completed   PNA vac Low Risk Adult  Completed   Colorectal cancer screening: No longer required.    Lung Cancer  Screening: (Low Dose CT Chest recommended if Age 14-80 years, 30 pack-year currently smoking OR have quit w/in 15years.) does not qualify.   Hepatitis C Screening: does not qualify.  Vision Screening: Recommended annual ophthalmology exams for early detection of glaucoma and other disorders of the eye. Is the patient up to date with their annual eye exam?  Yes   Dental Screening: Recommended annual dental exams for proper oral hygiene.  Community Resource Referral / Chronic Care Management: CRR required this visit?  No   CCM required this visit?  No      Plan:   Keep all routine maintenance appointments.   I have personally reviewed and noted the following in the patient's chart:   Medical and social history Use of alcohol, tobacco or illicit drugs  Current medications and supplements Functional ability and status Nutritional status Physical activity Advanced directives List of other physicians Hospitalizations, surgeries, and ER visits in previous 12 months Vitals Screenings to include cognitive, depression, and falls Referrals and appointments  In addition, I have reviewed and discussed with patient certain preventive protocols, quality metrics, and best practice recommendations. A written personalized care plan for preventive services as well as general preventive health recommendations were provided to patient via mail.     OBrien-Blaney, Margaret Staggs L, LPN   0/93/2671      I have reviewed the above information and agree with above.   Deborra Medina, MD

## 2020-09-06 DIAGNOSIS — S62353A Nondisplaced fracture of shaft of third metacarpal bone, left hand, initial encounter for closed fracture: Secondary | ICD-10-CM | POA: Diagnosis not present

## 2020-09-06 DIAGNOSIS — S62325A Displaced fracture of shaft of fourth metacarpal bone, left hand, initial encounter for closed fracture: Secondary | ICD-10-CM | POA: Diagnosis not present

## 2020-09-27 DIAGNOSIS — S62325A Displaced fracture of shaft of fourth metacarpal bone, left hand, initial encounter for closed fracture: Secondary | ICD-10-CM | POA: Diagnosis not present

## 2020-09-27 DIAGNOSIS — S62353A Nondisplaced fracture of shaft of third metacarpal bone, left hand, initial encounter for closed fracture: Secondary | ICD-10-CM | POA: Diagnosis not present

## 2020-10-18 ENCOUNTER — Other Ambulatory Visit: Payer: Self-pay | Admitting: Internal Medicine

## 2020-10-18 DIAGNOSIS — S62353A Nondisplaced fracture of shaft of third metacarpal bone, left hand, initial encounter for closed fracture: Secondary | ICD-10-CM | POA: Diagnosis not present

## 2020-10-18 DIAGNOSIS — S62325A Displaced fracture of shaft of fourth metacarpal bone, left hand, initial encounter for closed fracture: Secondary | ICD-10-CM | POA: Diagnosis not present

## 2020-10-18 MED ORDER — QUETIAPINE FUMARATE 50 MG PO TABS: ORAL_TABLET | ORAL | 2 refills | Status: DC

## 2020-10-18 MED ORDER — QUETIAPINE FUMARATE 25 MG PO TABS: 25.0000 mg | ORAL_TABLET | Freq: Every day | ORAL | 2 refills | Status: DC

## 2020-11-15 DIAGNOSIS — S62353A Nondisplaced fracture of shaft of third metacarpal bone, left hand, initial encounter for closed fracture: Secondary | ICD-10-CM | POA: Diagnosis not present

## 2020-11-15 DIAGNOSIS — S62325A Displaced fracture of shaft of fourth metacarpal bone, left hand, initial encounter for closed fracture: Secondary | ICD-10-CM | POA: Diagnosis not present

## 2021-01-12 ENCOUNTER — Telehealth: Payer: Self-pay | Admitting: Internal Medicine
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
## 2021-01-12 NOTE — Telephone Encounter (Signed)
Spoke with pt's daughter [name] to let her know that I spoke with Dr. Derrel Nip about what time happened today and that they need to take pt to the ED to be committed. Daughter stated that the mother will not get back in the car with the pt and they definitely will not be able to get her out of the car at the ED. Daughter stated pt has already said she is not going to the emergency room. The daughter put me on the phone the a family friend and gave me permission to speak with her about mother. Family friend just wanted to find out what we could to do to involuntarily commit the pt. I explained to both the daughter and the family friend that Dr. Derrel Nip is unable to involuntarily commit a pt but that they could do one of two things. I let them know that they could either go the magistrate's office and have involuntary commitment papers drawn up and have a police officer come tot he home to take the pt to the ED or they could just call 911 let them know what happened that they need help getting her to the ED. Daughter and family friend gave a verbal understanding.
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
## 2021-01-12 NOTE — Telephone Encounter (Signed)
I spoke to the patient's daughter [name] . They have located Ms. Felicia Anderson they have her in the care. The daughter stated she can't take much more of this.

## 2021-01-16 ENCOUNTER — Other Ambulatory Visit: Payer: Self-pay

## 2021-01-16 ENCOUNTER — Emergency Department
Admission: EM | Admit: 2021-01-16 | Discharge: 2021-01-17 | Disposition: A | Discharge status: Other Acute Inpatient Hospital | Payer: Medicare Other | Attending: Emergency Medicine | Admitting: Emergency Medicine

## 2021-01-16 DIAGNOSIS — Z87891 Personal history of nicotine dependence: Secondary | ICD-10-CM | POA: Insufficient documentation

## 2021-01-16 DIAGNOSIS — I10 Essential (primary) hypertension: Secondary | ICD-10-CM | POA: Diagnosis not present

## 2021-01-16 DIAGNOSIS — Z79899 Other long term (current) drug therapy: Secondary | ICD-10-CM | POA: Diagnosis not present

## 2021-01-16 DIAGNOSIS — X58XXXA Exposure to other specified factors, initial encounter: Secondary | ICD-10-CM | POA: Insufficient documentation

## 2021-01-16 DIAGNOSIS — F0391 Unspecified dementia with behavioral disturbance: Secondary | ICD-10-CM

## 2021-01-16 DIAGNOSIS — G309 Alzheimer's disease, unspecified: Secondary | ICD-10-CM | POA: Insufficient documentation

## 2021-01-16 DIAGNOSIS — G301 Alzheimer's disease with late onset: Secondary | ICD-10-CM

## 2021-01-16 DIAGNOSIS — Z8616 Personal history of COVID-19: Secondary | ICD-10-CM | POA: Diagnosis not present

## 2021-01-16 DIAGNOSIS — Y900 Blood alcohol level of less than 20 mg/100 ml: Secondary | ICD-10-CM | POA: Diagnosis not present

## 2021-01-16 DIAGNOSIS — S80811A Abrasion, right lower leg, initial encounter: Secondary | ICD-10-CM | POA: Diagnosis not present

## 2021-01-16 DIAGNOSIS — Z20822 Contact with and (suspected) exposure to covid-19: Secondary | ICD-10-CM | POA: Insufficient documentation

## 2021-01-16 DIAGNOSIS — F0280 Dementia in other diseases classified elsewhere without behavioral disturbance: Secondary | ICD-10-CM | POA: Diagnosis not present

## 2021-01-16 DIAGNOSIS — Z046 Encounter for general psychiatric examination, requested by authority: Secondary | ICD-10-CM | POA: Insufficient documentation

## 2021-01-16 DIAGNOSIS — S8991XA Unspecified injury of right lower leg, initial encounter: Secondary | ICD-10-CM | POA: Diagnosis present

## 2021-01-16 DIAGNOSIS — F0281 Dementia in other diseases classified elsewhere with behavioral disturbance: Secondary | ICD-10-CM | POA: Diagnosis present

## 2021-01-16 LAB — COMPREHENSIVE METABOLIC PANEL
ALT: 20 U/L (ref 0–44)
AST: 27 U/L (ref 15–41)
Albumin: 4.6 g/dL (ref 3.5–5.0)
Alkaline Phosphatase: 87 U/L (ref 38–126)
Anion gap: 10 (ref 5–15)
BUN: 16 mg/dL (ref 8–23)
CO2: 24 mmol/L (ref 22–32)
Calcium: 8.9 mg/dL (ref 8.9–10.3)
Chloride: 103 mmol/L (ref 98–111)
Creatinine, Ser: 0.83 mg/dL (ref 0.44–1.00)
GFR, Estimated: >60 mL/min (ref >60)
Glucose, Bld: 124 mg/dL — ABNORMAL HIGH (ref 70–99)
Potassium: 3.7 mmol/L (ref 3.5–5.1)
Sodium: 137 mmol/L (ref 135–145)
Total Bilirubin: 1.1 mg/dL (ref 0.3–1.2)
Total Protein: 8.6 g/dL — ABNORMAL HIGH (ref 6.5–8.1)

## 2021-01-16 LAB — URINALYSIS, COMPLETE (UACMP) WITH MICROSCOPIC
Bacteria, UA: NONE SEEN
Bilirubin Urine: NEGATIVE
Glucose, UA: NEGATIVE mg/dL
Ketones, ur: NEGATIVE mg/dL
Leukocytes,Ua: NEGATIVE
Nitrite: NEGATIVE
Protein, ur: NEGATIVE mg/dL
Specific Gravity, Urine: 1.003 — ABNORMAL LOW (ref 1.005–1.030)
Squamous Epithelial / HPF: NONE SEEN (ref 0–5)
pH: 7 (ref 5.0–8.0)

## 2021-01-16 LAB — RESP PANEL BY RT-PCR (FLU A&B, COVID) ARPGX2
Influenza A by PCR: NEGATIVE
Influenza B by PCR: NEGATIVE
SARS Coronavirus 2 by RT PCR: NEGATIVE

## 2021-01-16 LAB — URINE DRUG SCREEN, QUALITATIVE (ARMC ONLY)
Amphetamines, Ur Screen: NOT DETECTED
Barbiturates, Ur Screen: NOT DETECTED
Benzodiazepine, Ur Scrn: NOT DETECTED
Cannabinoid 50 Ng, Ur ~~LOC~~: NOT DETECTED
Cocaine Metabolite,Ur ~~LOC~~: NOT DETECTED
MDMA (Ecstasy)Ur Screen: NOT DETECTED
Methadone Scn, Ur: NOT DETECTED
Opiate, Ur Screen: NOT DETECTED
Phencyclidine (PCP) Ur S: NOT DETECTED
Tricyclic, Ur Screen: NOT DETECTED

## 2021-01-16 LAB — CBC
HCT: 44.7 % (ref 36.0–46.0)
Hemoglobin: 15.2 g/dL — ABNORMAL HIGH (ref 12.0–15.0)
MCH: 30.2 pg (ref 26.0–34.0)
MCHC: 34 g/dL (ref 30.0–36.0)
MCV: 88.9 fL (ref 80.0–100.0)
Platelets: 328 10*3/uL (ref 150–400)
RBC: 5.03 MIL/uL (ref 3.87–5.11)
RDW: 13.5 % (ref 11.5–15.5)
WBC: 12.4 10*3/uL — ABNORMAL HIGH (ref 4.0–10.5)
nRBC: 0 % (ref 0.0–0.2)

## 2021-01-16 LAB — ACETAMINOPHEN LEVEL: Acetaminophen (Tylenol), Serum: <10 ug/mL — ABNORMAL LOW (ref 10–30)

## 2021-01-16 LAB — SALICYLATE LEVEL: Salicylate Lvl: <7 mg/dL — ABNORMAL LOW (ref 7.0–30.0)

## 2021-01-16 LAB — ETHANOL: Alcohol, Ethyl (B): <10 mg/dL (ref <10)

## 2021-01-16 MED ORDER — DONEPEZIL HCL 5 MG PO TABS
10.0000 mg | ORAL_TABLET | Freq: Every day | ORAL | Status: DC
  Administered 2021-01-16: 10 mg via ORAL
  Filled 2021-01-16: qty 2

## 2021-01-16 MED ORDER — QUETIAPINE FUMARATE 25 MG PO TABS
25.0000 mg | ORAL_TABLET | Freq: Every day | ORAL | Status: DC
  Administered 2021-01-16: 25 mg via ORAL
  Filled 2021-01-16 (×2): qty 1

## 2021-01-16 MED ORDER — ALPRAZOLAM 0.25 MG PO TABS: 0.1250 mg | ORAL_TABLET | Freq: Two times a day (BID) | ORAL | Status: DC | PRN

## 2021-01-16 MED ORDER — ADULT MULTIVITAMIN W/MINERALS CH
1.0000 | ORAL_TABLET | Freq: Every day | ORAL | Status: DC
  Administered 2021-01-16 – 2021-01-17 (×2): 1 via ORAL
  Filled 2021-01-16 (×2): qty 1

## 2021-01-16 MED ORDER — VITAMIN B-12 1000 MCG PO TABS
1000.0000 ug | ORAL_TABLET | Freq: Every day | ORAL | Status: DC
  Administered 2021-01-17: 1000 ug via ORAL
  Filled 2021-01-16: qty 1

## 2021-01-16 MED ORDER — MELATONIN 5 MG PO TABS
5.0000 mg | ORAL_TABLET | Freq: Every day | ORAL | Status: DC
  Administered 2021-01-16: 5 mg via ORAL
  Filled 2021-01-16 (×2): qty 1

## 2021-01-16 MED ORDER — LORATADINE 10 MG PO TABS
10.0000 mg | ORAL_TABLET | Freq: Every day | ORAL | Status: DC
  Administered 2021-01-16 – 2021-01-17 (×2): 10 mg via ORAL
  Filled 2021-01-16 (×2): qty 1

## 2021-01-16 MED ORDER — ALPRAZOLAM 0.25 MG PO TABS
0.1250 mg | ORAL_TABLET | Freq: Two times a day (BID) | ORAL | Status: DC | PRN
  Administered 2021-01-16: 0.125 mg via ORAL
  Filled 2021-01-16: qty 1

## 2021-01-16 MED ORDER — RISPERIDONE 1 MG PO TBDP
0.5000 mg | ORAL_TABLET | Freq: Two times a day (BID) | ORAL | Status: DC
  Administered 2021-01-16 – 2021-01-17 (×2): 0.5 mg via ORAL
  Filled 2021-01-16 (×2): qty 1
  Filled 2021-01-16 (×3): qty 0.5

## 2021-01-16 MED ORDER — AMLODIPINE BESYLATE 5 MG PO TABS
5.0000 mg | ORAL_TABLET | Freq: Every day | ORAL | Status: DC
  Administered 2021-01-16 – 2021-01-17 (×2): 5 mg via ORAL
  Filled 2021-01-16 (×2): qty 1

## 2021-01-16 NOTE — BH Assessment (Signed)
Adult/GERO MH  Referral information for Psychiatric Hospitalization faxed to:   [facility] ([phone])   [facility] [phone] -or- [phone], [phone])   [facility] [phone]), No female unit   [place] [phone] or [phone]),  . [facility] [phone] -or- [phone])
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
## 2021-01-16 NOTE — ED Notes (Signed)
Report given to chris rn bhu nurse

## 2021-01-16 NOTE — ED Notes (Signed)
Patient transferred from ED to [facility] room 4 after screening for contraband. Report received from Amy, RN including Situation, Background, Assessment and Recommendations. Pt oriented to unit including Q15 minute rounds as well as the security cameras for their protection. Patient is alert and oriented, warm and dry in no acute distress. Patient denies SI, HI, and AVH. Pt. Encouraged to let this nurse know if needs arise.
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
## 2021-01-16 NOTE — BH Assessment (Signed)
Adult/GERO MH Referral checks:   Felicia Anderson ([phone]---[phone]) Left a message    [facility] [phone] -or- [phone], [phone]) Per Felicia Anderson, pt was denied due to high acuity.    [facility] [phone]), No female unit    Felicia Anderson [phone] or [phone]), Per Felicia Anderson, there's no inquiry nurse available until the morning.    Felicia Anderson [phone] -or- [phone]) No answer

## 2021-01-16 NOTE — ED Notes (Signed)
Hourly rounding performed, patient currently awake in day room. Patient has no complaints at this time. Q15 minute rounds and monitoring via [employer] to continue.

## 2021-01-16 NOTE — ED Notes (Signed)
Pt to bhu with er tech and security.
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
## 2021-01-16 NOTE — ED Notes (Signed)
Pt dressed out with this RN and John NT. Belongings include: Scientist, research (medical) Black shorts Underwear Flip flops cellphone

## 2021-01-16 NOTE — ED Notes (Signed)
Pt standing in door way asking how much longer will she be here.  Pt informed she ivc and will be here throughout the night. Pt agitated and tearful .  meds given.

## 2021-01-16 NOTE — ED Notes (Signed)
Dr clapacs in with pt

## 2021-01-16 NOTE — ED Notes (Signed)
Hourly rounding performed, patient currently asleep in room. Patient has no complaints at this time. Q15 minute rounds and monitoring via [employer] to continue.

## 2021-01-16 NOTE — ED Notes (Signed)
Pt denies need for snack, request water to drink. Pt educated on rules of BHU

## 2021-01-16 NOTE — ED Notes (Signed)
IVC, pend placement 

## 2021-01-16 NOTE — ED Notes (Signed)
Hourly rounding performed, patient currently awake in room. Patient has no complaints at this time. Q15 minute rounds and monitoring via [employer] to continue. 

## 2021-01-16 NOTE — ED Notes (Signed)
Pt ate dinner tray.
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
## 2021-01-17 DIAGNOSIS — U071 COVID-19: Secondary | ICD-10-CM | POA: Diagnosis not present

## 2021-01-17 DIAGNOSIS — I10 Essential (primary) hypertension: Secondary | ICD-10-CM | POA: Diagnosis not present

## 2021-01-17 DIAGNOSIS — Z8616 Personal history of COVID-19: Secondary | ICD-10-CM | POA: Diagnosis not present

## 2021-01-17 DIAGNOSIS — E119 Type 2 diabetes mellitus without complications: Secondary | ICD-10-CM | POA: Diagnosis not present

## 2021-01-17 DIAGNOSIS — Z79899 Other long term (current) drug therapy: Secondary | ICD-10-CM | POA: Diagnosis not present

## 2021-01-17 DIAGNOSIS — F411 Generalized anxiety disorder: Secondary | ICD-10-CM | POA: Diagnosis present

## 2021-01-17 DIAGNOSIS — E210 Primary hyperparathyroidism: Secondary | ICD-10-CM | POA: Diagnosis not present

## 2021-01-17 DIAGNOSIS — N6081 Other benign mammary dysplasias of right breast: Secondary | ICD-10-CM | POA: Diagnosis present

## 2021-01-17 DIAGNOSIS — R5383 Other fatigue: Secondary | ICD-10-CM | POA: Diagnosis not present

## 2021-01-17 DIAGNOSIS — I270 Primary pulmonary hypertension: Secondary | ICD-10-CM | POA: Diagnosis present

## 2021-01-17 DIAGNOSIS — Z20822 Contact with and (suspected) exposure to covid-19: Secondary | ICD-10-CM | POA: Diagnosis not present

## 2021-01-17 DIAGNOSIS — J302 Other seasonal allergic rhinitis: Secondary | ICD-10-CM | POA: Diagnosis not present

## 2021-01-17 DIAGNOSIS — R42 Dizziness and giddiness: Secondary | ICD-10-CM | POA: Diagnosis not present

## 2021-01-17 DIAGNOSIS — S80811A Abrasion, right lower leg, initial encounter: Secondary | ICD-10-CM | POA: Diagnosis not present

## 2021-01-17 DIAGNOSIS — N429 Disorder of prostate, unspecified: Secondary | ICD-10-CM | POA: Diagnosis not present

## 2021-01-17 DIAGNOSIS — E559 Vitamin D deficiency, unspecified: Secondary | ICD-10-CM | POA: Diagnosis not present

## 2021-01-17 DIAGNOSIS — R059 Cough, unspecified: Secondary | ICD-10-CM | POA: Diagnosis not present

## 2021-01-17 DIAGNOSIS — F0280 Dementia in other diseases classified elsewhere without behavioral disturbance: Secondary | ICD-10-CM | POA: Diagnosis not present

## 2021-01-17 DIAGNOSIS — G301 Alzheimer's disease with late onset: Secondary | ICD-10-CM | POA: Diagnosis not present

## 2021-01-17 DIAGNOSIS — B349 Viral infection, unspecified: Secondary | ICD-10-CM | POA: Diagnosis not present

## 2021-01-17 DIAGNOSIS — G309 Alzheimer's disease, unspecified: Secondary | ICD-10-CM | POA: Diagnosis not present

## 2021-01-17 DIAGNOSIS — F0390 Unspecified dementia without behavioral disturbance: Secondary | ICD-10-CM | POA: Diagnosis not present

## 2021-01-17 DIAGNOSIS — R0981 Nasal congestion: Secondary | ICD-10-CM | POA: Diagnosis not present

## 2021-01-17 DIAGNOSIS — F0391 Unspecified dementia with behavioral disturbance: Secondary | ICD-10-CM | POA: Diagnosis present

## 2021-01-17 DIAGNOSIS — Z20828 Contact with and (suspected) exposure to other viral communicable diseases: Secondary | ICD-10-CM | POA: Diagnosis not present

## 2021-01-17 DIAGNOSIS — Z712 Person consulting for explanation of examination or test findings: Secondary | ICD-10-CM | POA: Diagnosis not present

## 2021-01-17 MED ORDER — IBUPROFEN 800 MG PO TABS
800.0000 mg | ORAL_TABLET | Freq: Once | ORAL | Status: AC
  Administered 2021-01-17: 800 mg via ORAL
  Filled 2021-01-17: qty 1

## 2021-01-17 NOTE — BH Assessment (Signed)
Patient has been accepted to [facility] on today 01/17/21.  Patient assigned to Geriatric Unit. Accepting physician is Dr. Margaretmary Bayley.  Call report to [phone].  Representative was [employer].   ER Staff is aware of it:  Glenda, ER Secretary  Dr. Kerman Passey, ER MD  Radonna Ricker, Patient's Nurse     Patient's Family/Support System [name] Owens Shark [phone]- [phone]) has been updated as well.

## 2021-01-17 NOTE — ED Notes (Signed)
Hourly rounding performed, patient currently asleep in room. Patient has no complaints at this time. Q15 minute rounds and monitoring via [employer] to continue.

## 2021-01-17 NOTE — ED Provider Notes (Signed)
-----------------------------------------   2:11 PM on 01/17/2021 ----------------------------------------- Patient has been accepted to [facility].  Currently awaiting transport.  No acute issues overnight or today.  Recent lab work reviewed and appears within normal limits.   Harvest Dark, MD 01/17/21 [phone]

## 2021-01-17 NOTE — ED Notes (Signed)
[place] sheriff dept here for transport.  Pt calm and cooperative.
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
## 2021-01-17 NOTE — BH Assessment (Signed)
Adult MH  Referral information for Psychiatric Hospitalization faxed to:   [facility] ([phone]-or- [phone]),   [facility] ([phone]),   [facility] ([phone] -or- [phone]),   [facility] [phone] -or- [phone]) [phone]   [facility] ([phone]---[phone]---[phone]),   [facility] ([phone] or [phone])   [facility] ([phone] or [phone]),   [facility] ([phone]). 

## 2021-01-17 NOTE — ED Notes (Signed)
Pt given breakfast.

## 2021-01-17 NOTE — ED Notes (Signed)
Daughter called and informed of transfer.

## 2021-01-17 NOTE — ED Notes (Signed)
Given lunch

## 2021-01-17 NOTE — ED Notes (Signed)
Pt asleep at this time, unable to collect vitals. Will collect pt vitals once awake. 

## 2021-01-17 NOTE — ED Notes (Signed)
Pt transferred to [employer] with Advice worker.  Pt alert, calm and cooperative.

## 2021-01-17 NOTE — ED Notes (Signed)
Resumed care from [facility].  Pt lying in bed, alert.   Pt waiting on transport

## 2021-02-08 ENCOUNTER — Ambulatory Visit (INDEPENDENT_AMBULATORY_CARE_PROVIDER_SITE_OTHER): Payer: Medicare Other | Admitting: Internal Medicine

## 2021-02-08 ENCOUNTER — Ambulatory Visit (INDEPENDENT_AMBULATORY_CARE_PROVIDER_SITE_OTHER): Payer: Medicare Other

## 2021-02-08 ENCOUNTER — Encounter: Payer: Self-pay | Admitting: Internal Medicine

## 2021-02-08 ENCOUNTER — Other Ambulatory Visit: Payer: Self-pay

## 2021-02-08 VITALS — BP 100/58 | HR 64 | Temp 96.2°F | Resp 16 | Ht 62.0 in | Wt 155.4 lb

## 2021-02-08 DIAGNOSIS — G301 Alzheimer's disease with late onset: Secondary | ICD-10-CM | POA: Diagnosis not present

## 2021-02-08 DIAGNOSIS — R531 Weakness: Secondary | ICD-10-CM

## 2021-02-08 DIAGNOSIS — F411 Generalized anxiety disorder: Secondary | ICD-10-CM | POA: Diagnosis not present

## 2021-02-08 DIAGNOSIS — F422 Mixed obsessional thoughts and acts: Secondary | ICD-10-CM | POA: Diagnosis not present

## 2021-02-08 DIAGNOSIS — F0391 Unspecified dementia with behavioral disturbance: Secondary | ICD-10-CM | POA: Diagnosis not present

## 2021-02-08 DIAGNOSIS — I700 Atherosclerosis of aorta: Secondary | ICD-10-CM

## 2021-02-08 DIAGNOSIS — J439 Emphysema, unspecified: Secondary | ICD-10-CM

## 2021-02-08 DIAGNOSIS — R079 Chest pain, unspecified: Secondary | ICD-10-CM

## 2021-02-08 DIAGNOSIS — F0281 Dementia in other diseases classified elsewhere with behavioral disturbance: Secondary | ICD-10-CM | POA: Diagnosis not present

## 2021-02-08 DIAGNOSIS — Z79899 Other long term (current) drug therapy: Secondary | ICD-10-CM

## 2021-02-08 LAB — COMPREHENSIVE METABOLIC PANEL
ALT: 16 U/L (ref 0–35)
AST: 22 U/L (ref 0–37)
Albumin: 3.7 g/dL (ref 3.5–5.2)
Alkaline Phosphatase: 67 U/L (ref 39–117)
BUN: 15 mg/dL (ref 6–23)
CO2: 27 mEq/L (ref 19–32)
Calcium: 8.2 mg/dL — ABNORMAL LOW (ref 8.4–10.5)
Chloride: 103 mEq/L (ref 96–112)
Creatinine, Ser: 1.23 mg/dL — ABNORMAL HIGH (ref 0.40–1.20)
GFR: 42.04 mL/min — ABNORMAL LOW (ref >60.00)
Glucose, Bld: 113 mg/dL — ABNORMAL HIGH (ref 70–99)
Potassium: 4.2 mEq/L (ref 3.5–5.1)
Sodium: 140 mEq/L (ref 135–145)
Total Bilirubin: 0.4 mg/dL (ref 0.2–1.2)
Total Protein: 7 g/dL (ref 6.0–8.3)

## 2021-02-08 MED ORDER — "SYRINGE 25G X 1"" 3 ML MISC": 0 refills | Status: AC

## 2021-02-08 MED ORDER — CYANOCOBALAMIN 1000 MCG/ML IJ SOLN: 1000.0000 ug | INTRAMUSCULAR | 11 refills | Status: AC

## 2021-02-08 MED ORDER — QUETIAPINE FUMARATE 25 MG PO TABS: 25.0000 mg | ORAL_TABLET | Freq: Two times a day (BID) | ORAL | 5 refills | Status: DC
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
## 2021-02-08 NOTE — Patient Instructions (Signed)
Start weekly B12 injections  Check BP at home 5 times in the next 2 weeks   I will make a referral for local psychiatrist  and for Home PT

## 2021-02-11 ENCOUNTER — Encounter: Payer: Self-pay | Admitting: Internal Medicine

## 2021-02-11 DIAGNOSIS — R531 Weakness: Secondary | ICD-10-CM | POA: Insufficient documentation

## 2021-02-11 DIAGNOSIS — R079 Chest pain, unspecified: Secondary | ICD-10-CM | POA: Insufficient documentation

## 2021-02-11 NOTE — Assessment & Plan Note (Signed)
Atypical,  With no evidence of ischemia on today's EKG,  Which I ordered and reviewed

## 2021-02-11 NOTE — Assessment & Plan Note (Signed)
During her breif involuntary hospitalization,  Her medication regiemn was changed from celexa and alprazolam to lexparo, luvox  For presumed OCD. Psychiatry referral In process

## 2021-02-11 NOTE — Assessment & Plan Note (Addendum)
She has recently been hospitalized involuntarily for management of aggressive behavior.  Medication changes reviewed and included use of seroquel twice daily  , luvox, Namenda and Depakot .  Thus far she has not exhibited any recurrent behavioral problems at home.  No medication changes today, but home PT ordered teo manage reports of bilateral leg weakness

## 2021-02-11 NOTE — Assessment & Plan Note (Signed)
She reports bilateral weakness that appear to be amplified by the current medication regiment.  Home health referral for PT  In process

## 2021-02-12 DIAGNOSIS — J439 Emphysema, unspecified: Secondary | ICD-10-CM | POA: Insufficient documentation

## 2021-02-12 DIAGNOSIS — I700 Atherosclerosis of aorta: Secondary | ICD-10-CM | POA: Insufficient documentation

## 2021-02-12 NOTE — Addendum Note (Signed)
Addended by: Crecencio Mc on: 02/12/2021 03:52 PM   Modules accepted: Orders

## 2021-02-12 NOTE — Assessment & Plan Note (Signed)
Noted on chest x ray

## 2021-02-19 ENCOUNTER — Telehealth: Payer: Self-pay | Admitting: Internal Medicine

## 2021-02-19 ENCOUNTER — Other Ambulatory Visit: Payer: Self-pay | Admitting: Internal Medicine

## 2021-02-19 DIAGNOSIS — L603 Nail dystrophy: Secondary | ICD-10-CM

## 2021-02-19 NOTE — Telephone Encounter (Signed)
Patients daughter called in to see if another referral can be sent to [facility] [place].They tried to schedule an appointment but the referral is no longer in their system and their office told them they need a new referral sent over.

## 2021-02-22 DIAGNOSIS — F411 Generalized anxiety disorder: Secondary | ICD-10-CM | POA: Diagnosis not present

## 2021-02-22 DIAGNOSIS — F422 Mixed obsessional thoughts and acts: Secondary | ICD-10-CM | POA: Diagnosis not present

## 2021-02-22 DIAGNOSIS — R531 Weakness: Secondary | ICD-10-CM | POA: Diagnosis not present

## 2021-02-22 DIAGNOSIS — Z9181 History of falling: Secondary | ICD-10-CM | POA: Diagnosis not present

## 2021-02-22 DIAGNOSIS — G301 Alzheimer's disease with late onset: Secondary | ICD-10-CM | POA: Diagnosis not present

## 2021-02-22 DIAGNOSIS — Z79899 Other long term (current) drug therapy: Secondary | ICD-10-CM | POA: Diagnosis not present

## 2021-02-22 DIAGNOSIS — F0281 Dementia in other diseases classified elsewhere with behavioral disturbance: Secondary | ICD-10-CM | POA: Diagnosis not present

## 2021-02-24 ENCOUNTER — Other Ambulatory Visit: Payer: Self-pay | Admitting: Internal Medicine

## 2021-02-24 DIAGNOSIS — N183 Chronic kidney disease, stage 3 unspecified: Secondary | ICD-10-CM

## 2021-02-26 ENCOUNTER — Ambulatory Visit (INDEPENDENT_AMBULATORY_CARE_PROVIDER_SITE_OTHER): Payer: Medicare Other | Admitting: Podiatry

## 2021-02-26 ENCOUNTER — Other Ambulatory Visit: Payer: Self-pay

## 2021-02-26 ENCOUNTER — Encounter: Payer: Self-pay | Admitting: Podiatry

## 2021-02-26 DIAGNOSIS — M79674 Pain in right toe(s): Secondary | ICD-10-CM

## 2021-02-26 DIAGNOSIS — B351 Tinea unguium: Secondary | ICD-10-CM

## 2021-02-26 DIAGNOSIS — L603 Nail dystrophy: Secondary | ICD-10-CM | POA: Diagnosis not present

## 2021-02-26 DIAGNOSIS — M21612 Bunion of left foot: Secondary | ICD-10-CM | POA: Diagnosis not present

## 2021-02-26 DIAGNOSIS — M79675 Pain in left toe(s): Secondary | ICD-10-CM

## 2021-02-26 DIAGNOSIS — L608 Other nail disorders: Secondary | ICD-10-CM | POA: Diagnosis not present

## 2021-02-26 NOTE — Progress Notes (Signed)
  Subjective:  Patient ID: Felicia Anderson, female    DOB: 1941-03-07,  MRN: [id number]  Chief Complaint  Patient presents with   Nail Problem   Bunions    80 y.o. female presents with the above complaint. History confirmed with patient.  Nails are thickened elongated and she is unable to cut them she is here with her daughter.  She does have some dementia.  She also is bunion left foot but is not painful for her  Objective:  Physical Exam: warm, good capillary refill, bunions, no trophic changes or ulcerative lesions, normal DP and PT pulses, and normal sensory exam. Left Foot: dystrophic yellowed discolored nail plates with subungual debris Right Foot: dystrophic yellowed discolored nail plates with subungual debris  Assessment:   1. Onychomycosis   2. Bunion, left foot   3. Pain due to onychomycosis of toenails of both feet      Plan:  Patient was evaluated and treated and all questions answered.  Discussed the etiology and treatment options for the condition in detail with the patient. Educated patient on the topical and oral treatment options for mycotic nails. Recommended debridement of the nails today. Sharp and mechanical debridement performed of all painful and mycotic nails today. Nails debrided in length and thickness using a nail nipper to level of comfort. Discussed treatment options including appropriate shoe gear. Follow up as needed for painful nails.   I did take a culture and I will message her when I have the results determine if we want to treat this with anything  Her bunion deformity is not currently painful and she will continue to monitor this.  We discussed wider shoe gear. Return in about 3 months (around 05/29/2021) for [place].

## 2021-02-27 DIAGNOSIS — R531 Weakness: Secondary | ICD-10-CM | POA: Diagnosis not present

## 2021-02-27 DIAGNOSIS — F422 Mixed obsessional thoughts and acts: Secondary | ICD-10-CM | POA: Diagnosis not present

## 2021-02-27 DIAGNOSIS — G301 Alzheimer's disease with late onset: Secondary | ICD-10-CM | POA: Diagnosis not present

## 2021-02-27 DIAGNOSIS — F411 Generalized anxiety disorder: Secondary | ICD-10-CM | POA: Diagnosis not present

## 2021-02-27 DIAGNOSIS — F0281 Dementia in other diseases classified elsewhere with behavioral disturbance: Secondary | ICD-10-CM | POA: Diagnosis not present

## 2021-02-27 DIAGNOSIS — Z9181 History of falling: Secondary | ICD-10-CM | POA: Diagnosis not present

## 2021-03-06 ENCOUNTER — Telehealth: Payer: Self-pay | Admitting: Internal Medicine

## 2021-03-06 DIAGNOSIS — G301 Alzheimer's disease with late onset: Secondary | ICD-10-CM | POA: Diagnosis not present

## 2021-03-06 DIAGNOSIS — F422 Mixed obsessional thoughts and acts: Secondary | ICD-10-CM | POA: Diagnosis not present

## 2021-03-06 DIAGNOSIS — Z9181 History of falling: Secondary | ICD-10-CM | POA: Diagnosis not present

## 2021-03-06 DIAGNOSIS — F411 Generalized anxiety disorder: Secondary | ICD-10-CM | POA: Diagnosis not present

## 2021-03-06 DIAGNOSIS — R531 Weakness: Secondary | ICD-10-CM | POA: Diagnosis not present

## 2021-03-06 DIAGNOSIS — F0281 Dementia in other diseases classified elsewhere with behavioral disturbance: Secondary | ICD-10-CM | POA: Diagnosis not present

## 2021-03-06 NOTE — Telephone Encounter (Signed)
Patients daughter is calling to follow up on the chest x-ray her mother had in [place] call her at [phone].

## 2021-03-08 NOTE — Telephone Encounter (Signed)
LMTCB

## 2021-03-09 ENCOUNTER — Encounter: Payer: Self-pay | Admitting: Podiatry

## 2021-03-12 ENCOUNTER — Other Ambulatory Visit (INDEPENDENT_AMBULATORY_CARE_PROVIDER_SITE_OTHER): Payer: Medicare Other

## 2021-03-12 ENCOUNTER — Other Ambulatory Visit: Payer: Self-pay

## 2021-03-12 DIAGNOSIS — R944 Abnormal results of kidney function studies: Secondary | ICD-10-CM

## 2021-03-12 DIAGNOSIS — Z79899 Other long term (current) drug therapy: Secondary | ICD-10-CM | POA: Diagnosis not present

## 2021-03-12 LAB — COMPREHENSIVE METABOLIC PANEL
ALT: 18 U/L (ref 0–35)
AST: 21 U/L (ref 0–37)
Albumin: 3.8 g/dL (ref 3.5–5.2)
Alkaline Phosphatase: 73 U/L (ref 39–117)
BUN: 22 mg/dL (ref 6–23)
CO2: 29 mEq/L (ref 19–32)
Calcium: 8.7 mg/dL (ref 8.4–10.5)
Chloride: 101 mEq/L (ref 96–112)
Creatinine, Ser: 1.27 mg/dL — ABNORMAL HIGH (ref 0.40–1.20)
GFR: 40.43 mL/min — ABNORMAL LOW (ref >60.00)
Glucose, Bld: 84 mg/dL (ref 70–99)
Potassium: 5 mEq/L (ref 3.5–5.1)
Sodium: 140 mEq/L (ref 135–145)
Total Bilirubin: 0.6 mg/dL (ref 0.2–1.2)
Total Protein: 7.2 g/dL (ref 6.0–8.3)

## 2021-03-12 LAB — CBC WITH DIFFERENTIAL/PLATELET
Basophils Absolute: 0 10*3/uL (ref 0.0–0.1)
Basophils Relative: 0.4 % (ref 0.0–3.0)
Eosinophils Absolute: 0 10*3/uL (ref 0.0–0.7)
Eosinophils Relative: 0 % (ref 0.0–5.0)
HCT: 41.9 % (ref 36.0–46.0)
Hemoglobin: 14.1 g/dL (ref 12.0–15.0)
Lymphocytes Relative: 18.4 % (ref 12.0–46.0)
Lymphs Abs: 1.5 10*3/uL (ref 0.7–4.0)
MCHC: 33.6 g/dL (ref 30.0–36.0)
MCV: 91.9 fl (ref 78.0–100.0)
Monocytes Absolute: 0.7 10*3/uL (ref 0.1–1.0)
Monocytes Relative: 8.4 % (ref 3.0–12.0)
Neutro Abs: 5.9 10*3/uL (ref 1.4–7.7)
Neutrophils Relative %: 72.8 % (ref 43.0–77.0)
Platelets: 228 10*3/uL (ref 150.0–400.0)
RBC: 4.56 Mil/uL (ref 3.87–5.11)
RDW: 15 % (ref 11.5–15.5)
WBC: 8.1 10*3/uL (ref 4.0–10.5)

## 2021-03-13 DIAGNOSIS — F422 Mixed obsessional thoughts and acts: Secondary | ICD-10-CM | POA: Diagnosis not present

## 2021-03-13 DIAGNOSIS — R531 Weakness: Secondary | ICD-10-CM | POA: Diagnosis not present

## 2021-03-13 DIAGNOSIS — Z9181 History of falling: Secondary | ICD-10-CM | POA: Diagnosis not present

## 2021-03-13 DIAGNOSIS — F411 Generalized anxiety disorder: Secondary | ICD-10-CM | POA: Diagnosis not present

## 2021-03-13 DIAGNOSIS — G301 Alzheimer's disease with late onset: Secondary | ICD-10-CM | POA: Diagnosis not present

## 2021-03-13 DIAGNOSIS — F0281 Dementia in other diseases classified elsewhere with behavioral disturbance: Secondary | ICD-10-CM | POA: Diagnosis not present

## 2021-03-13 NOTE — Telephone Encounter (Signed)
LMTCB

## 2021-03-16 ENCOUNTER — Other Ambulatory Visit: Payer: Self-pay | Admitting: Internal Medicine

## 2021-03-16 DIAGNOSIS — Z5181 Encounter for therapeutic drug level monitoring: Secondary | ICD-10-CM

## 2021-03-16 DIAGNOSIS — R944 Abnormal results of kidney function studies: Secondary | ICD-10-CM | POA: Insufficient documentation

## 2021-03-16 NOTE — Addendum Note (Signed)
Addended by: Crecencio Mc on: 03/16/2021 08:56 AM   Modules accepted: Orders

## 2021-03-16 NOTE — Assessment & Plan Note (Signed)
Patient's GFR has been significantly lower since her ER visit and subsequent admission  to [facility].  Medication changes included initiation of Depakote and Namenda.  Renal failure /effects of GFR are not not listed as a common AE of either medication and her recent depakote  level was normal .  She was already taking Seroquel.  . Referring to nephrology for evaluation if non use of NSAIDs can be confirmed

## 2021-03-21 ENCOUNTER — Telehealth: Payer: Self-pay

## 2021-03-21 DIAGNOSIS — F0281 Dementia in other diseases classified elsewhere with behavioral disturbance: Secondary | ICD-10-CM | POA: Diagnosis not present

## 2021-03-21 DIAGNOSIS — G301 Alzheimer's disease with late onset: Secondary | ICD-10-CM

## 2021-03-21 DIAGNOSIS — Z9181 History of falling: Secondary | ICD-10-CM | POA: Diagnosis not present

## 2021-03-21 DIAGNOSIS — F422 Mixed obsessional thoughts and acts: Secondary | ICD-10-CM | POA: Diagnosis not present

## 2021-03-21 DIAGNOSIS — R531 Weakness: Secondary | ICD-10-CM | POA: Diagnosis not present

## 2021-03-21 DIAGNOSIS — F411 Generalized anxiety disorder: Secondary | ICD-10-CM | POA: Diagnosis not present

## 2021-03-21 NOTE — Telephone Encounter (Signed)
Felicia Anderson called from [place] and wanted to report multiple falls. Daughter told Felicia Anderson that pt fell six times in on day at the end of last week. She has bruise on left flank. That was from a fall separate from the six day one. She fell in the yard in the last week. Daughter can not remember exact days of falls. She thinks she is on too much medication. Please advise. FYI

## 2021-03-22 ENCOUNTER — Other Ambulatory Visit: Payer: Self-pay | Admitting: Internal Medicine

## 2021-03-22 NOTE — Telephone Encounter (Signed)
No appts available. Daughter would like to discuss and see if pt is taking to much medication that could be causing her to fall so much. Pt has not had any injury with the falls they are just concerned that she is falling so often. [name] stated that PT was out working the pt yesterday and noticed that she leans to the right when she is walking. [name] also stated that she has stopped giving her the Aricept because she read that it is very bad on the kidneys. She stated that since she has stopped that medication the pt is not sleeping all day, has started eating more and "looks a lot better".

## 2021-03-23 NOTE — Telephone Encounter (Signed)
Additon to previous note Dr.Kapur*

## 2021-03-23 NOTE — Telephone Encounter (Signed)
Placed call to pt. Spoke with pts daughter. Daughter states Felicia Anderson does not have a psychiatrist and the previous psychiatrist her mom saw, her mom did not like and doesn't want to see her anymore. Pts daughter ask can provider send in another referral for psychiatry and to not send it to [place].Please advise

## 2021-03-24 DIAGNOSIS — Z9181 History of falling: Secondary | ICD-10-CM | POA: Diagnosis not present

## 2021-03-24 DIAGNOSIS — R531 Weakness: Secondary | ICD-10-CM | POA: Diagnosis not present

## 2021-03-24 DIAGNOSIS — F0281 Dementia in other diseases classified elsewhere with behavioral disturbance: Secondary | ICD-10-CM | POA: Diagnosis not present

## 2021-03-24 DIAGNOSIS — G301 Alzheimer's disease with late onset: Secondary | ICD-10-CM | POA: Diagnosis not present

## 2021-03-24 DIAGNOSIS — F411 Generalized anxiety disorder: Secondary | ICD-10-CM | POA: Diagnosis not present

## 2021-03-24 DIAGNOSIS — Z79899 Other long term (current) drug therapy: Secondary | ICD-10-CM | POA: Diagnosis not present

## 2021-03-24 DIAGNOSIS — F422 Mixed obsessional thoughts and acts: Secondary | ICD-10-CM | POA: Diagnosis not present

## 2021-03-27 NOTE — Addendum Note (Signed)
Addended by: Crecencio Mc on: 03/27/2021 12:08 PM   Modules accepted: Orders

## 2021-03-27 NOTE — Telephone Encounter (Signed)
REFERRAL IN PROGRESS BUT DEPENDING ON HOW QUICKLY  THEY CAN SEE HER SHE MAY NEED TO SCHEDULE WITH ME .   IF Felicia Anderson NEEDS TO BRING HER WE CAN WORK AROUND HER SCHEDULE EVEN IF WE HAVE TO MOVE A 4:30

## 2021-03-28 NOTE — Telephone Encounter (Signed)
Patient's daughter has been notified  

## 2021-03-28 NOTE — Telephone Encounter (Signed)
Daughter stated that she would like for pt to been seen sooner than she can get an appt. The daughter is retired and stated that she can do any day any time. Would you like for me to schedule her in one of the 12:30 slots on a Thursday?

## 2021-03-28 NOTE — Telephone Encounter (Signed)
LMTCB

## 2021-03-29 DIAGNOSIS — Z9181 History of falling: Secondary | ICD-10-CM | POA: Diagnosis not present

## 2021-03-29 DIAGNOSIS — G301 Alzheimer's disease with late onset: Secondary | ICD-10-CM | POA: Diagnosis not present

## 2021-03-29 DIAGNOSIS — R531 Weakness: Secondary | ICD-10-CM | POA: Diagnosis not present

## 2021-03-29 DIAGNOSIS — F411 Generalized anxiety disorder: Secondary | ICD-10-CM | POA: Diagnosis not present

## 2021-03-29 DIAGNOSIS — F0281 Dementia in other diseases classified elsewhere with behavioral disturbance: Secondary | ICD-10-CM | POA: Diagnosis not present

## 2021-03-29 DIAGNOSIS — F422 Mixed obsessional thoughts and acts: Secondary | ICD-10-CM | POA: Diagnosis not present

## 2021-03-30 DIAGNOSIS — Z9181 History of falling: Secondary | ICD-10-CM | POA: Diagnosis not present

## 2021-03-30 DIAGNOSIS — G301 Alzheimer's disease with late onset: Secondary | ICD-10-CM | POA: Diagnosis not present

## 2021-03-30 DIAGNOSIS — F0281 Dementia in other diseases classified elsewhere with behavioral disturbance: Secondary | ICD-10-CM | POA: Diagnosis not present

## 2021-03-30 DIAGNOSIS — F411 Generalized anxiety disorder: Secondary | ICD-10-CM | POA: Diagnosis not present

## 2021-03-30 DIAGNOSIS — R531 Weakness: Secondary | ICD-10-CM | POA: Diagnosis not present

## 2021-03-30 DIAGNOSIS — F422 Mixed obsessional thoughts and acts: Secondary | ICD-10-CM | POA: Diagnosis not present

## 2021-04-02 DIAGNOSIS — Z9181 History of falling: Secondary | ICD-10-CM | POA: Diagnosis not present

## 2021-04-02 DIAGNOSIS — F0281 Dementia in other diseases classified elsewhere with behavioral disturbance: Secondary | ICD-10-CM | POA: Diagnosis not present

## 2021-04-02 DIAGNOSIS — F411 Generalized anxiety disorder: Secondary | ICD-10-CM | POA: Diagnosis not present

## 2021-04-02 DIAGNOSIS — F422 Mixed obsessional thoughts and acts: Secondary | ICD-10-CM | POA: Diagnosis not present

## 2021-04-02 DIAGNOSIS — G301 Alzheimer's disease with late onset: Secondary | ICD-10-CM | POA: Diagnosis not present

## 2021-04-02 DIAGNOSIS — R531 Weakness: Secondary | ICD-10-CM | POA: Diagnosis not present

## 2021-04-03 ENCOUNTER — Encounter: Payer: Self-pay | Admitting: Internal Medicine

## 2021-04-03 ENCOUNTER — Ambulatory Visit (INDEPENDENT_AMBULATORY_CARE_PROVIDER_SITE_OTHER): Payer: Medicare Other | Admitting: Internal Medicine

## 2021-04-03 ENCOUNTER — Other Ambulatory Visit: Payer: Self-pay

## 2021-04-03 VITALS — BP 130/66 | HR 52 | Temp 95.4°F | Ht 62.0 in | Wt 150.6 lb

## 2021-04-03 DIAGNOSIS — K5909 Other constipation: Secondary | ICD-10-CM | POA: Diagnosis not present

## 2021-04-03 DIAGNOSIS — Z79899 Other long term (current) drug therapy: Secondary | ICD-10-CM | POA: Diagnosis not present

## 2021-04-03 DIAGNOSIS — N1831 Chronic kidney disease, stage 3a: Secondary | ICD-10-CM | POA: Diagnosis not present

## 2021-04-03 DIAGNOSIS — E782 Mixed hyperlipidemia: Secondary | ICD-10-CM

## 2021-04-03 DIAGNOSIS — Z23 Encounter for immunization: Secondary | ICD-10-CM

## 2021-04-03 DIAGNOSIS — F02818 Dementia in other diseases classified elsewhere, unspecified severity, with other behavioral disturbance: Secondary | ICD-10-CM

## 2021-04-03 DIAGNOSIS — F0281 Dementia in other diseases classified elsewhere with behavioral disturbance: Secondary | ICD-10-CM

## 2021-04-03 DIAGNOSIS — G301 Alzheimer's disease with late onset: Secondary | ICD-10-CM

## 2021-04-03 DIAGNOSIS — I700 Atherosclerosis of aorta: Secondary | ICD-10-CM

## 2021-04-03 LAB — LIPID PANEL
Cholesterol: 105 mg/dL (ref 0–200)
HDL: 41 mg/dL (ref >39.00)
LDL Cholesterol: 34 mg/dL (ref 0–99)
NonHDL: 64.45
Total CHOL/HDL Ratio: 3
Triglycerides: 150 mg/dL — ABNORMAL HIGH (ref 0.0–149.0)
VLDL: 30 mg/dL (ref 0.0–40.0)

## 2021-04-03 LAB — HEPATIC FUNCTION PANEL
ALT: 17 U/L (ref 0–35)
AST: 20 U/L (ref 0–37)
Albumin: 3.8 g/dL (ref 3.5–5.2)
Alkaline Phosphatase: 62 U/L (ref 39–117)
Bilirubin, Direct: 0.1 mg/dL (ref 0.0–0.3)
Total Bilirubin: 0.5 mg/dL (ref 0.2–1.2)
Total Protein: 6.7 g/dL (ref 6.0–8.3)

## 2021-04-03 LAB — VITAMIN D 25 HYDROXY (VIT D DEFICIENCY, FRACTURES): VITD: 74.51 ng/mL (ref 30.00–100.00)

## 2021-04-03 LAB — BASIC METABOLIC PANEL
BUN: 16 mg/dL (ref 6–23)
CO2: 31 mEq/L (ref 19–32)
Calcium: 8.6 mg/dL (ref 8.4–10.5)
Chloride: 104 mEq/L (ref 96–112)
Creatinine, Ser: 1.11 mg/dL (ref 0.40–1.20)
GFR: 47.51 mL/min — ABNORMAL LOW (ref >60.00)
Glucose, Bld: 110 mg/dL — ABNORMAL HIGH (ref 70–99)
Potassium: 4.8 mEq/L (ref 3.5–5.1)
Sodium: 142 mEq/L (ref 135–145)

## 2021-04-03 LAB — TSH: TSH: 3.15 u[IU]/mL (ref 0.35–5.50)

## 2021-04-03 MED ORDER — FLUVOXAMINE MALEATE 100 MG PO TABS: 100.0000 mg | ORAL_TABLET | Freq: Every day | ORAL | 1 refills | Status: AC

## 2021-04-03 MED ORDER — DIVALPROEX SODIUM ER 250 MG PO TB24: 500.0000 mg | ORAL_TABLET | Freq: Every day | ORAL | 5 refills | Status: AC

## 2021-04-03 MED ORDER — QUETIAPINE FUMARATE 25 MG PO TABS: 25.0000 mg | ORAL_TABLET | Freq: Two times a day (BID) | ORAL | 5 refills | Status: AC

## 2021-04-03 NOTE — Progress Notes (Signed)
Subjective:  Patient ID: Felicia Anderson, female    DOB: 1941-03-08  Age: 80 y.o. MRN: [id number]  CC: The primary encounter diagnosis was Long-term use of high-risk medication. Diagnoses of Stage 3a chronic kidney disease ([place]), Hypocalcemia, Chronic constipation, Mixed hyperlipidemia, Late onset 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[place]), Need for immunization against influenza, and Thoracic aortic atherosclerosis ([place]) were also pertinent to this visit.  HPI Felicia Anderson presents for follow up on lethargy , frequent falls secondary to over medication  Chief Complaint  Patient presents with   Follow-up    Follow up on multiple falls   80 yr old female recently involuntarily hospitalized for agitation and aggressive behavior in the setting f dementia. When she was seen for hospital follow up she appeared to be much more calm but over the last 2 weeks family has reported increased lethargy and frequent falls.  She was advised to stop the aricept ;  family has also stopped the Namenda at the suggestion of a [place] (retired Therapist, sports).  And DAUGHTER HAS REDUCED THE DEPAKOTE  DOSE TO 500 MG DAILY . Per famly her lethargy has improved significantly.  Patient is drinking more fluids,  is more interactive,  has not fallen and is generally much improved. fAMILY HAS noted some inappropriate adversarial comments but no aggressive behavior when quetiapine  morning  dose was  missed recently.   Reviewed findings of prior Chest x ray today..  Patient is not taking a statin.  He has no signs or sypotms of emphysema     Outpatient Medications Prior to Visit  Medication Sig Dispense Refill   amLODipine (NORVASC) 5 MG tablet TAKE 1 TABLET (5 MG TOTAL) BY MOUTH DAILY. 90 tablet 1   B Complex Vitamins (VITAMIN B COMPLEX PO) Take by mouth.     Cholecalciferol (VITAMIN D3) 100000 UNIT/GM POWD Take by mouth.     cyanocobalamin (,VITAMIN B-12,) 1000 MCG/ML injection Inject 1 mL (1,000 mcg total) into the  muscle once a week. 4 mL 11   escitalopram (LEXAPRO) 5 MG tablet Take 5 mg by mouth at bedtime.     loratadine (CLARITIN) 10 MG tablet Take 10 mg by mouth daily.     Melatonin 5 MG TABS Take 1 tablet by mouth at bedtime.     Multiple Vitamin (MULTIVITAMIN) tablet Take 1 tablet by mouth daily.     Syringe/Needle, Disp, (SYRINGE 3CC/25GX1") 25G X 1" 3 ML MISC Use for b12 injections 50 each 0   vitamin B-12 (CYANOCOBALAMIN) 1000 MCG tablet Take 1,000 mcg by mouth daily.     divalproex (DEPAKOTE ER) 250 MG 24 hr tablet Take 750 mg by mouth at bedtime.     fluvoxaMINE (LUVOX) 100 MG tablet Take 100 mg by mouth at bedtime.     QUEtiapine (SEROQUEL) 25 MG tablet Take 1 tablet (25 mg total) by mouth 2 (two) times daily. 1 tablet daily with 25 mg tablet 60 tablet 5   memantine (NAMENDA) 10 MG tablet Take 10 mg by mouth 2 (two) times daily. (Patient not taking: Reported on 04/03/2021)     No facility-administered medications prior to visit.    Review of Systems;  Patient denies headache, fevers, malaise, unintentional weight loss, skin rash, eye pain, sinus congestion and sinus pain, sore throat, dysphagia,  hemoptysis , cough, dyspnea, wheezing, chest pain, palpitations, orthopnea, edema, abdominal pain, nausea, melena, diarrhea, constipation, flank pain, dysuria, hematuria, urinary  Frequency, nocturia, numbness, tingling, seizures,  Focal weakness, Loss of consciousness,  Tremor, insomnia, depression, anxiety, and suicidal ideation.      Objective:  BP 130/66 (BP Location: Left Arm, Patient Position: Sitting, Cuff Size: Normal)   Pulse (!) 52   Temp (!) 95.4 F (35.2 C) (Temporal)   Ht '5\' 2"'$  (1.575 m)   Wt 150 lb 9.6 oz (68.3 kg)   SpO2 97%   BMI 27.55 kg/m   BP Readings from Last 3 Encounters:  04/03/21 130/66  02/08/21 (!) 100/58  01/17/21 115/68    Wt Readings from Last 3 Encounters:  04/03/21 150 lb 9.6 oz (68.3 kg)  02/08/21 155 lb 6.4 oz (70.5 kg)  01/16/21 145 lb (65.8 kg)     General appearance: alert, cooperative and appears stated age Ears: normal TM's and external ear canals both ears Throat: lips, mucosa, and tongue normal; teeth and gums normal Neck: no adenopathy, no carotid bruit, supple, symmetrical, trachea midline and thyroid not enlarged, symmetric, no tenderness/mass/nodules Back: symmetric, no curvature. ROM normal. No CVA tenderness. Lungs: clear to auscultation bilaterally Heart: regular rate and rhythm, S1, S2 normal, no murmur, click, rub or gallop Abdomen: soft, non-tender; bowel sounds normal; no masses,  no organomegaly Pulses: 2+ and symmetric Skin: Skin color, texture, turgor normal. No rashes or lesions Lymph nodes: Cervical, supraclavicular, and axillary nodes normal. Neuro: CNs 2-12 intact. DTRs 2+/4 in biceps, brachioradialis, patellars and achilles. Muscle strength 5/5 in upper and lower exremities. Fine resting tremor bilaterally both hands cerebellar function normal. Romberg negative.  No pronator drift.   Gait normal.   Lab Results  Component Value Date   HGBA1C 5.9 07/06/2020    Lab Results  Component Value Date   CREATININE 1.11 04/03/2021   CREATININE 1.27 (H) 03/12/2021   CREATININE 1.23 (H) 02/08/2021    Lab Results  Component Value Date   WBC 8.1 03/12/2021   HGB 14.1 03/12/2021   HCT 41.9 03/12/2021   PLT 228.0 03/12/2021   GLUCOSE 110 (H) 04/03/2021   CHOL 105 04/03/2021   TRIG 150.0 (H) 04/03/2021   HDL 41.00 04/03/2021   LDLCALC 34 04/03/2021   ALT 17 04/03/2021   AST 20 04/03/2021   NA 142 04/03/2021   K 4.8 04/03/2021   CL 104 04/03/2021   CREATININE 1.11 04/03/2021   BUN 16 04/03/2021   CO2 31 04/03/2021   TSH 3.15 04/03/2021   HGBA1C 5.9 07/06/2020    No results found.  Assessment & Plan:   Problem List Items Addressed This Visit       Unprioritized   Chronic constipation   Relevant Orders   TSH (Completed)   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HCC)    DID NOT  TOLERATE Namenda and Aricept due to excessive sedation.  Both have been discontinued.  Continue Seroquel 25 mg bid for agitation and aggressive behavior.       Relevant Medications   fluvoxaMINE (LUVOX) 100 MG tablet   divalproex (DEPAKOTE ER) 250 MG 24 hr tablet   QUEtiapine (SEROQUEL) 25 MG tablet   Hypocalcemia   Thoracic aortic atherosclerosis (HCC)    Noted on chest x ray done in July. Discussed with daughter and patient.  Given her history of dementia,  Will consider starting statin therapy at next visit       Other Visit Diagnoses     Long-term use of high-risk medication    -  Primary   Relevant Orders   Valproic Acid level   Stage 3a chronic kidney disease ([place])  Relevant Orders   Basic metabolic panel (Completed)   PTH, Intact and Calcium   VITAMIN D 25 Hydroxy (Vit-D Deficiency, Fractures) (Completed)   Hepatic function panel (Completed)   Mixed hyperlipidemia       Relevant Orders   Lipid panel (Completed)   Need for immunization against influenza       Relevant Orders   Flu Vaccine QUAD High Dose(Fluad) (Completed)       I provided  30 minutes of  face-to-face time during this encounter reviewing patient's current problems and past surgeries, labs and imaging studies , and coordination  of care .  Meds ordered this encounter  Medications   fluvoxaMINE (LUVOX) 100 MG tablet    Sig: Take 1 tablet (100 mg total) by mouth at bedtime.    Dispense:  90 tablet    Refill:  1   divalproex (DEPAKOTE ER) 250 MG 24 hr tablet    Sig: Take 2 tablets (500 mg total) by mouth at bedtime.    Dispense:  60 tablet    Refill:  5   QUEtiapine (SEROQUEL) 25 MG tablet    Sig: Take 1 tablet (25 mg total) by mouth 2 (two) times daily.    Dispense:  60 tablet    Refill:  5    KEEP ON FILE FOR FUTURE REFILLS    Medications Discontinued During This Encounter  Medication Reason   fluvoxaMINE (LUVOX) 100 MG tablet Reorder   divalproex (DEPAKOTE ER) 250 MG 24 hr tablet     memantine (NAMENDA) 10 MG tablet    QUEtiapine (SEROQUEL) 25 MG tablet     Follow-up: No follow-ups on file.   Crecencio Mc, MD

## 2021-04-03 NOTE — Assessment & Plan Note (Addendum)
DID NOT TOLERATE Namenda and Aricept due to excessive sedation.  Both have been discontinued.  Continue Seroquel 25 mg bid for agitation and aggressive behavior.

## 2021-04-03 NOTE — Patient Instructions (Signed)
I totally agree with the medication changes you made   The quetiapine is the ONLY medication she is taking that will control the agression.  DO NOT LET IT LAPSE

## 2021-04-03 NOTE — Assessment & Plan Note (Signed)
Noted on chest x ray done in July. Discussed with daughter and patient.  Given her history of dementia,  Will consider starting statin therapy at next visit

## 2021-04-04 DIAGNOSIS — R531 Weakness: Secondary | ICD-10-CM | POA: Diagnosis not present

## 2021-04-04 DIAGNOSIS — F0281 Dementia in other diseases classified elsewhere with behavioral disturbance: Secondary | ICD-10-CM | POA: Diagnosis not present

## 2021-04-04 DIAGNOSIS — F422 Mixed obsessional thoughts and acts: Secondary | ICD-10-CM | POA: Diagnosis not present

## 2021-04-04 DIAGNOSIS — F411 Generalized anxiety disorder: Secondary | ICD-10-CM | POA: Diagnosis not present

## 2021-04-04 DIAGNOSIS — Z9181 History of falling: Secondary | ICD-10-CM | POA: Diagnosis not present

## 2021-04-04 DIAGNOSIS — G301 Alzheimer's disease with late onset: Secondary | ICD-10-CM | POA: Diagnosis not present

## 2021-04-04 LAB — PTH, INTACT AND CALCIUM
Calcium: 8.6 mg/dL (ref 8.6–10.4)
PTH: 53 pg/mL (ref 16–77)

## 2021-04-04 LAB — VALPROIC ACID LEVEL: Valproic Acid Lvl: 74.2 mg/L (ref 50.0–100.0)

## 2021-04-10 ENCOUNTER — Other Ambulatory Visit: Payer: Self-pay | Admitting: Nephrology

## 2021-04-10 DIAGNOSIS — N1831 Chronic kidney disease, stage 3a: Secondary | ICD-10-CM

## 2021-04-10 DIAGNOSIS — I10 Essential (primary) hypertension: Secondary | ICD-10-CM | POA: Diagnosis not present

## 2021-04-14 ENCOUNTER — Other Ambulatory Visit: Payer: Self-pay

## 2021-04-14 ENCOUNTER — Emergency Department
Admission: EM | Admit: 2021-04-14 | Discharge: 2021-04-15 | Disposition: A | Discharge status: Home / Self Care | Payer: Medicare Other | Attending: Emergency Medicine | Admitting: Emergency Medicine

## 2021-04-14 DIAGNOSIS — Z8616 Personal history of COVID-19: Secondary | ICD-10-CM | POA: Insufficient documentation

## 2021-04-14 DIAGNOSIS — Z87891 Personal history of nicotine dependence: Secondary | ICD-10-CM | POA: Insufficient documentation

## 2021-04-14 DIAGNOSIS — Z79899 Other long term (current) drug therapy: Secondary | ICD-10-CM | POA: Insufficient documentation

## 2021-04-14 DIAGNOSIS — I10 Essential (primary) hypertension: Secondary | ICD-10-CM | POA: Diagnosis not present

## 2021-04-14 DIAGNOSIS — G309 Alzheimer's disease, unspecified: Secondary | ICD-10-CM | POA: Insufficient documentation

## 2021-04-14 DIAGNOSIS — F0281 Dementia in other diseases classified elsewhere with behavioral disturbance: Secondary | ICD-10-CM | POA: Insufficient documentation

## 2021-04-14 DIAGNOSIS — N390 Urinary tract infection, site not specified: Secondary | ICD-10-CM | POA: Diagnosis not present

## 2021-04-14 DIAGNOSIS — R5381 Other malaise: Secondary | ICD-10-CM | POA: Diagnosis not present

## 2021-04-14 DIAGNOSIS — F0390 Unspecified dementia without behavioral disturbance: Secondary | ICD-10-CM | POA: Diagnosis not present

## 2021-04-14 LAB — URINALYSIS, COMPLETE (UACMP) WITH MICROSCOPIC
Bilirubin Urine: NEGATIVE
Glucose, UA: NEGATIVE mg/dL
Hgb urine dipstick: NEGATIVE
Ketones, ur: 5 mg/dL — AB
Nitrite: NEGATIVE
Protein, ur: NEGATIVE mg/dL
Specific Gravity, Urine: 1.009 (ref 1.005–1.030)
pH: 7 (ref 5.0–8.0)

## 2021-04-14 LAB — BASIC METABOLIC PANEL
Anion gap: 11 (ref 5–15)
BUN: 23 mg/dL (ref 8–23)
CO2: 23 mmol/L (ref 22–32)
Calcium: 8.7 mg/dL — ABNORMAL LOW (ref 8.9–10.3)
Chloride: 100 mmol/L (ref 98–111)
Creatinine, Ser: 0.9 mg/dL (ref 0.44–1.00)
GFR, Estimated: >60 mL/min (ref >60)
Glucose, Bld: 104 mg/dL — ABNORMAL HIGH (ref 70–99)
Potassium: 3.8 mmol/L (ref 3.5–5.1)
Sodium: 134 mmol/L — ABNORMAL LOW (ref 135–145)

## 2021-04-14 LAB — CBC WITH DIFFERENTIAL/PLATELET
Abs Immature Granulocytes: 0.05 10*3/uL (ref 0.00–0.07)
Basophils Absolute: 0 10*3/uL (ref 0.0–0.1)
Basophils Relative: 0 %
Eosinophils Absolute: 0 10*3/uL (ref 0.0–0.5)
Eosinophils Relative: 0 %
HCT: 41.7 % (ref 36.0–46.0)
Hemoglobin: 14.2 g/dL (ref 12.0–15.0)
Immature Granulocytes: 1 %
Lymphocytes Relative: 13 %
Lymphs Abs: 1.3 10*3/uL (ref 0.7–4.0)
MCH: 31.5 pg (ref 26.0–34.0)
MCHC: 34.1 g/dL (ref 30.0–36.0)
MCV: 92.5 fL (ref 80.0–100.0)
Monocytes Absolute: 0.9 10*3/uL (ref 0.1–1.0)
Monocytes Relative: 9 %
Neutro Abs: 8 10*3/uL — ABNORMAL HIGH (ref 1.7–7.7)
Neutrophils Relative %: 77 %
Platelets: 208 10*3/uL (ref 150–400)
RBC: 4.51 MIL/uL (ref 3.87–5.11)
RDW: 13.7 % (ref 11.5–15.5)
WBC: 10.3 10*3/uL (ref 4.0–10.5)
nRBC: 0 % (ref 0.0–0.2)

## 2021-04-14 MED ORDER — SODIUM CHLORIDE 0.9 % IV SOLN
1.0000 g | Freq: Once | INTRAVENOUS | Status: AC
  Administered 2021-04-14: 1 g via INTRAVENOUS
  Filled 2021-04-14: qty 10

## 2021-04-14 MED ORDER — SODIUM CHLORIDE 0.9 % IV BOLUS
1000.0000 mL | Freq: Once | INTRAVENOUS | Status: AC
Start: 2021-04-14 — End: 2021-04-14
  Administered 2021-04-14: 1000 mL via INTRAVENOUS

## 2021-04-14 MED ORDER — SODIUM CHLORIDE 0.9 % IV BOLUS
500.0000 mL | Freq: Once | INTRAVENOUS | Status: AC
  Administered 2021-04-14: 500 mL via INTRAVENOUS

## 2021-04-14 MED ORDER — CEPHALEXIN 500 MG PO CAPS: 500.0000 mg | ORAL_CAPSULE | Freq: Four times a day (QID) | ORAL | 0 refills | Status: DC

## 2021-04-14 NOTE — ED Provider Notes (Signed)
-----------------------------------------   10:53 PM on 04/14/2021 -----------------------------------------  Blood pressure (!) 145/69, pulse (!) 50, temperature 97.9 F (36.6 C), temperature source Oral, resp. rate 18, height 5\' 1"  (1.549 m), weight 68.5 kg, SpO2 100 %.  Assuming care from [facility]  In short, Felicia Anderson is a 80 y.o. female with a chief complaint of Urinary Tract Infection .  Refer to the original H&P for additional details.  The current plan of care is to provide fluids and check urine.  Patient arrived with increased confusion, foul-smelling urine.  There was concern that the patient may have a UTI.  Patient appeared clinically dehydrated according to the previous provider and was given fluids.  Her labs were reassuring and we are awaiting a urinalysis.  This returned with white blood cells and bacteria.  Given the increased confusion with these findings I do suspect a component of UTI will provide Rocephin here in the emergency department and discharged with Keflex.  Labs are otherwise reassuring and patient appears stable for discharge.   ED diagnosis:  UTI    Brynda Peon 04/14/21 2323    Carrie Mew, MD 04/15/21 [id number]

## 2021-04-14 NOTE — ED Triage Notes (Signed)
Pt arrives via EMS from home after her family states they have noticed a foul odor coming from her urine and increased confusion- pt has a hx of dementia- pt denies burning when she pees, but when asked why she was here she states she has "been taking classes for the last 4 months"

## 2021-04-14 NOTE — ED Provider Notes (Signed)
[facility] Emergency Department Provider Note ____________________________________________  Time seen: 1830  I have reviewed the triage vital signs and the nursing notes.  HISTORY  Chief Complaint  Urinary Tract Infection Presents to the ED HPI Felicia Anderson is a 80 y.o. female with a history of hypertension Alzheimer's dementia, and emphysema, presents to the ED for evaluation of increased confusion and foul-smelling odor.  She presents via EMS from home, with the patient's family endorsing change from her baseline.  Patient herself is unaware of any symptoms, and is only alert to person.  No fevers, nausea, vomiting, or diarrhea are reported.  Past Medical History:  Diagnosis Date   Hypertension    Small bowel obstruction due to adhesions [facility])     Patient Active Problem List   Diagnosis Date Noted   Decreased calculated glomerular filtration rate (GFR) 03/16/2021   Emphysema lung ([place]) 02/12/2021   Thoracic aortic atherosclerosis ([place]) 02/12/2021   Generalized weakness 02/11/2021   Chest pain 02/11/2021   Primary hyperparathyroidism ([place]) 08/02/2020   Weight gain 07/09/2020   Hypocalcemia 07/07/2020   Educated about COVID-19 virus infection 11/08/2018   Breast cancer screening 04/22/2017   Screening for colon cancer 04/22/2017   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[place]) 01/12/2017   Hypertension 08/25/2016   Chronic constipation 08/25/2016   Generalized anxiety disorder 08/25/2016    Past Surgical History:  Procedure Laterality Date   [place]    Prior to Admission medications   Medication Sig Start Date End Date Taking? Authorizing Provider  cephALEXin (KEFLEX) 500 MG capsule Take 1 capsule (500 mg total) by mouth 4 (four) times daily for 10 days. 04/14/21 04/24/21 Yes Cuthriell, Charline Bills, PA-C  amLODipine (NORVASC) 5 MG tablet TAKE 1 TABLET (5 MG TOTAL) BY MOUTH DAILY.  02/26/21   Crecencio Mc, MD  B Complex Vitamins (VITAMIN B COMPLEX PO) Take by mouth.    [provider]  Cholecalciferol (VITAMIN D3) 100000 UNIT/GM POWD Take by mouth.    [provider]  cyanocobalamin (,VITAMIN B-12,) 1000 MCG/ML injection Inject 1 mL (1,000 mcg total) into the muscle once a week. 02/08/21   Crecencio Mc, MD  divalproex (DEPAKOTE ER) 250 MG 24 hr tablet Take 2 tablets (500 mg total) by mouth at bedtime. 04/03/21   Crecencio Mc, MD  escitalopram (LEXAPRO) 5 MG tablet Take 5 mg by mouth at bedtime.    [provider]  fluvoxaMINE (LUVOX) 100 MG tablet Take 1 tablet (100 mg total) by mouth at bedtime. 04/03/21   Crecencio Mc, MD  loratadine (CLARITIN) 10 MG tablet Take 10 mg by mouth daily.    [provider]  Melatonin 5 MG TABS Take 1 tablet by mouth at bedtime.    [provider]  Multiple Vitamin (MULTIVITAMIN) tablet Take 1 tablet by mouth daily.    [provider]  QUEtiapine (SEROQUEL) 25 MG tablet Take 1 tablet (25 mg total) by mouth 2 (two) times daily. 04/03/21   Crecencio Mc, MD  Syringe/Needle, Disp, (SYRINGE 3CC/25GX1") 25G X 1" 3 ML MISC Use for b12 injections 02/08/21   Crecencio Mc, MD  vitamin B-12 (CYANOCOBALAMIN) 1000 MCG tablet Take 1,000 mcg by mouth daily.    [provider]    Allergies Morphine and related  Family History  Problem Relation Age of Onset   Cancer Mother    Heart attack  Father    Alcohol abuse Father    Alzheimer's disease Sister    Cerebral aneurysm Brother    Hypertension Daughter    Hypertension Son    Hypertension Daughter     Social History Social History   Tobacco Use   Smoking status: Former   Smokeless tobacco: Never  Scientific laboratory technician Use: Never used  Substance Use Topics   Alcohol use: No   Drug use: No    Review of Systems  Constitutional: Negative for fever.  Increased confusion. Eyes: Negative for visual changes. ENT: Negative  for sore throat. Cardiovascular: Negative for chest pain. Respiratory: Negative for shortness of breath. Gastrointestinal: Negative for abdominal pain, vomiting and diarrhea. Genitourinary: Negative for dysuria.  Malodorous urine. Musculoskeletal: Negative for back pain. Skin: Negative for rash. Neurological: Negative for headaches, focal weakness or numbness. ____________________________________________  PHYSICAL EXAM:  VITAL SIGNS: ED Triage Vitals  Enc Vitals Group     BP 04/14/21 1717 (!) 145/69     Pulse Rate 04/14/21 1717 (!) 50     Resp 04/14/21 1717 18     Temp 04/14/21 1717 97.9 F (36.6 C)     Temp Source 04/14/21 1717 Oral     SpO2 04/14/21 1717 100 %     Weight 04/14/21 1718 151 lb (68.5 kg)     Height 04/14/21 1718 5\' 1"  (1.549 m)     Head Circumference --      Peak Flow --      Pain Score 04/14/21 1718 0     Pain Loc --      Pain Edu? --      Excl. in [place]? --     Constitutional: Alert and oriented. Well appearing and in no distress. Aroused to verbal stimuli. Head: Normocephalic and atraumatic. Eyes: Conjunctivae are normal. Normal extraocular movements Mouth/Throat: Mucous membranes are dry. Dentures in place Neck: Supple. No thyromegaly. Hematological/Lymphatic/Immunological: No cervical lymphadenopathy. Cardiovascular: Normal rate, regular rhythm. Normal distal pulses. Respiratory: Normal respiratory effort. No wheezes/rales/rhonchi. Gastrointestinal: Soft and nontender. No distention. Musculoskeletal: Nontender with normal range of motion in all extremities.  Neurologic: Normal speech and language. No gross focal neurologic deficits are appreciated. Skin:  Skin is warm, dry and intact. No rash noted. ____________________________________________    {LABS (pertinent positives/negatives) Labs Reviewed  URINALYSIS, COMPLETE (UACMP) WITH MICROSCOPIC - Abnormal; Notable for the following components:      Result Value   Color, Urine YELLOW (*)     APPearance CLEAR (*)    Ketones, ur 5 (*)    Leukocytes,Ua MODERATE (*)    Bacteria, UA RARE (*)    All other components within normal limits  CBC WITH DIFFERENTIAL/PLATELET - Abnormal; Notable for the following components:   Neutro Abs 8.0 (*)    All other components within normal limits  BASIC METABOLIC PANEL - Abnormal; Notable for the following components:   Sodium 134 (*)    Glucose, Bld 104 (*)    Calcium 8.7 (*)    All other components within normal limits    ____________________________________________  {EKG  ____________________________________________   RADIOLOGY Official radiology report(s): No results found. ____________________________________________  PROCEDURES  NS 1000 ml bolus IV  Procedures ____________________________________________   INITIAL IMPRESSION / ASSESSMENT AND PLAN / ED COURSE  As part of my medical decision making, I reviewed the following data within the [place] History obtained from family, Labs reviewed as noted, and Notes from prior ED visits    DDX: UTI,  sepsis, electrolyte abnormality  Geriatric patient with history of Alzheimer dementia presents from facility, with concern for possible UTI.  Patient was reported to have malodorous urine and a change in baseline confusion per the family.  She is evaluated for complaints in ED, found to be clinically dehydrated and unable to produce urine initially.  Patient is given IV fluids and placed on a pure wick.    I will sign her out to the oncoming provider Betha Loa, PA-C), at this time, who will await urinalysis and dispo the patient appropriately.  Felicia Anderson was evaluated in Emergency Department on 04/15/2021 for the symptoms described in the history of present illness. She was evaluated in the context of the global COVID-19 pandemic, which necessitated consideration that the patient might be at risk for infection with the SARS-CoV-2 virus that causes COVID-19.  Institutional protocols and algorithms that pertain to the evaluation of patients at risk for COVID-19 are in a state of rapid change based on information released by regulatory bodies including the CDC and federal and state organizations. These policies and algorithms were followed during the patient's care in the ED. ____________________________________________  FINAL CLINICAL IMPRESSION(S) / ED DIAGNOSES  Final diagnoses:  Lower urinary tract infectious disease      Melvenia Needles, PA-C 04/15/21 1213    Vanessa Buckland, MD 04/15/21 1225

## 2021-04-14 NOTE — ED Notes (Signed)
Pt comes via EMs with c/o possible UTI. EMS reports odor. Pt does have hx of dementia. VSS

## 2021-04-18 DIAGNOSIS — F422 Mixed obsessional thoughts and acts: Secondary | ICD-10-CM | POA: Diagnosis not present

## 2021-04-18 DIAGNOSIS — Z9181 History of falling: Secondary | ICD-10-CM | POA: Diagnosis not present

## 2021-04-18 DIAGNOSIS — R531 Weakness: Secondary | ICD-10-CM | POA: Diagnosis not present

## 2021-04-18 DIAGNOSIS — F0281 Dementia in other diseases classified elsewhere with behavioral disturbance: Secondary | ICD-10-CM | POA: Diagnosis not present

## 2021-04-18 DIAGNOSIS — F411 Generalized anxiety disorder: Secondary | ICD-10-CM | POA: Diagnosis not present

## 2021-04-18 DIAGNOSIS — G301 Alzheimer's disease with late onset: Secondary | ICD-10-CM | POA: Diagnosis not present

## 2021-04-23 ENCOUNTER — Other Ambulatory Visit: Payer: Self-pay

## 2021-04-23 ENCOUNTER — Inpatient Hospital Stay
Admission: EM | Admit: 2021-04-23 | Discharge: 2021-04-27 | DRG: 056 | Disposition: A | Discharge status: Skilled Nursing Facility | Payer: Medicare Other | Attending: Internal Medicine | Admitting: Internal Medicine

## 2021-04-23 ENCOUNTER — Telehealth: Payer: Self-pay | Admitting: Internal Medicine

## 2021-04-23 ENCOUNTER — Emergency Department: Payer: Medicare Other

## 2021-04-23 DIAGNOSIS — R5383 Other fatigue: Secondary | ICD-10-CM | POA: Diagnosis present

## 2021-04-23 DIAGNOSIS — G309 Alzheimer's disease, unspecified: Principal | ICD-10-CM | POA: Diagnosis present

## 2021-04-23 DIAGNOSIS — Z79899 Other long term (current) drug therapy: Secondary | ICD-10-CM

## 2021-04-23 DIAGNOSIS — Z885 Allergy status to narcotic agent status: Secondary | ICD-10-CM

## 2021-04-23 DIAGNOSIS — Z811 Family history of alcohol abuse and dependence: Secondary | ICD-10-CM

## 2021-04-23 DIAGNOSIS — R4182 Altered mental status, unspecified: Secondary | ICD-10-CM

## 2021-04-23 DIAGNOSIS — G9341 Metabolic encephalopathy: Secondary | ICD-10-CM | POA: Diagnosis present

## 2021-04-23 DIAGNOSIS — R262 Difficulty in walking, not elsewhere classified: Secondary | ICD-10-CM | POA: Diagnosis present

## 2021-04-23 DIAGNOSIS — F02818 Dementia in other diseases classified elsewhere, unspecified severity, with other behavioral disturbance: Secondary | ICD-10-CM | POA: Diagnosis not present

## 2021-04-23 DIAGNOSIS — E876 Hypokalemia: Secondary | ICD-10-CM | POA: Diagnosis present

## 2021-04-23 DIAGNOSIS — G9340 Encephalopathy, unspecified: Secondary | ICD-10-CM | POA: Diagnosis present

## 2021-04-23 DIAGNOSIS — I10 Essential (primary) hypertension: Secondary | ICD-10-CM | POA: Diagnosis not present

## 2021-04-23 DIAGNOSIS — N390 Urinary tract infection, site not specified: Secondary | ICD-10-CM | POA: Diagnosis not present

## 2021-04-23 DIAGNOSIS — Z8249 Family history of ischemic heart disease and other diseases of the circulatory system: Secondary | ICD-10-CM

## 2021-04-23 DIAGNOSIS — R531 Weakness: Secondary | ICD-10-CM | POA: Diagnosis present

## 2021-04-23 DIAGNOSIS — Z20822 Contact with and (suspected) exposure to covid-19: Secondary | ICD-10-CM | POA: Diagnosis not present

## 2021-04-23 DIAGNOSIS — Z820 Family history of epilepsy and other diseases of the nervous system: Secondary | ICD-10-CM

## 2021-04-23 LAB — CBC
HCT: 38.1 % (ref 36.0–46.0)
Hemoglobin: 13.4 g/dL (ref 12.0–15.0)
MCH: 32.1 pg (ref 26.0–34.0)
MCHC: 35.2 g/dL (ref 30.0–36.0)
MCV: 91.4 fL (ref 80.0–100.0)
Platelets: 237 10*3/uL (ref 150–400)
RBC: 4.17 MIL/uL (ref 3.87–5.11)
RDW: 14.1 % (ref 11.5–15.5)
WBC: 7.7 10*3/uL (ref 4.0–10.5)
nRBC: 0 % (ref 0.0–0.2)

## 2021-04-23 LAB — COMPREHENSIVE METABOLIC PANEL
ALT: 27 U/L (ref 0–44)
AST: 35 U/L (ref 15–41)
Albumin: 3.7 g/dL (ref 3.5–5.0)
Alkaline Phosphatase: 52 U/L (ref 38–126)
Anion gap: 10 (ref 5–15)
BUN: 20 mg/dL (ref 8–23)
CO2: 26 mmol/L (ref 22–32)
Calcium: 8.5 mg/dL — ABNORMAL LOW (ref 8.9–10.3)
Chloride: 102 mmol/L (ref 98–111)
Creatinine, Ser: 0.95 mg/dL (ref 0.44–1.00)
GFR, Estimated: >60 mL/min (ref >60)
Glucose, Bld: 99 mg/dL (ref 70–99)
Potassium: 4.1 mmol/L (ref 3.5–5.1)
Sodium: 138 mmol/L (ref 135–145)
Total Bilirubin: 1 mg/dL (ref 0.3–1.2)
Total Protein: 7 g/dL (ref 6.5–8.1)

## 2021-04-23 LAB — TROPONIN I (HIGH SENSITIVITY): Troponin I (High Sensitivity): 7 ng/L (ref <18)

## 2021-04-23 LAB — DIFFERENTIAL
Abs Immature Granulocytes: 0.03 10*3/uL (ref 0.00–0.07)
Basophils Absolute: 0 10*3/uL (ref 0.0–0.1)
Basophils Relative: 0 %
Eosinophils Absolute: 0 10*3/uL (ref 0.0–0.5)
Eosinophils Relative: 0 %
Immature Granulocytes: 0 %
Lymphocytes Relative: 20 %
Lymphs Abs: 1.5 10*3/uL (ref 0.7–4.0)
Monocytes Absolute: 0.6 10*3/uL (ref 0.1–1.0)
Monocytes Relative: 8 %
Neutro Abs: 5.6 10*3/uL (ref 1.7–7.7)
Neutrophils Relative %: 72 %

## 2021-04-23 MED ORDER — SODIUM CHLORIDE 0.9% FLUSH
3.0000 mL | Freq: Once | INTRAVENOUS | Status: AC
Start: 2021-04-23 — End: 2021-04-24
  Administered 2021-04-24: 3 mL via INTRAVENOUS

## 2021-04-23 NOTE — ED Provider Notes (Signed)
[facility] Emergency Department Provider Note  ____________________________________________   Event Date/Time   First MD Initiated Contact with Patient 04/23/21 2338     (approximate)  I have reviewed the triage vital signs and the nursing notes.   HISTORY  Chief Complaint Weakness    HPI Felicia Anderson is a 80 y.o. female with history of hypertension, dementia who presents to the emergency department with her daughter for concerns for worsening altered mental status for the past couple of weeks.  Daughter provides most of the history.  She states patient was just in the emergency department the end of September and diagnosed with a UTI and is still on cephalexin for this.  She states that her urinary odor has resolved but she still is more confused than normal and this seems to be worsening especially at night rather than getting better.  Daughter was concerned that she could still have a UTI or be having a stroke.  She has not had any complaints of pain.  She has had a recent fall last night that was unwitnessed.  She is not on any blood thinners.  No other medication changes.  No recent fevers, cough, vomiting, diarrhea.  Patient lives at home with her daughter and son-in-law.  Daughter states that while in the waiting area the patient was talking about it snowing outside now.  She also asked when they were going to leave the dance.  Daughter reports that this confusion is worse than normal for her.        Past Medical History:  Diagnosis Date   Hypertension    Small bowel obstruction due to adhesions [facility])     Patient Active Problem List   Diagnosis Date Noted   Decreased calculated glomerular filtration rate (GFR) 03/16/2021   Emphysema lung ([place]) 02/12/2021   Thoracic aortic atherosclerosis ([place]) 02/12/2021   Generalized weakness 02/11/2021   Chest pain 02/11/2021   Primary hyperparathyroidism ([place]) 08/02/2020   Weight gain 07/09/2020    Hypocalcemia 07/07/2020   Educated about COVID-19 virus infection 11/08/2018   Breast cancer screening 04/22/2017   Screening for colon cancer 04/22/2017   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 Authorizing Provider  amLODipine (NORVASC) 5 MG tablet TAKE 1 TABLET (5 MG TOTAL) BY MOUTH DAILY. 02/26/21   Crecencio Mc, MD  B Complex Vitamins (VITAMIN B COMPLEX PO) Take by mouth.    [provider]  cephALEXin (KEFLEX) 500 MG capsule Take 1 capsule (500 mg total) by mouth 4 (four) times daily for 10 days. 04/14/21 04/24/21  Cuthriell, Charline Bills, PA-C  Cholecalciferol (VITAMIN D3) 100000 UNIT/GM POWD Take by mouth.    [provider]  cyanocobalamin (,VITAMIN B-12,) 1000 MCG/ML injection Inject 1 mL (1,000 mcg total) into the muscle once a week. 02/08/21   Crecencio Mc, MD  divalproex (DEPAKOTE ER) 250 MG 24 hr tablet Take 2 tablets (500 mg total) by mouth at bedtime. 04/03/21   Crecencio Mc, MD  escitalopram (LEXAPRO) 5 MG tablet Take 5 mg by mouth at bedtime.    [provider]  fluvoxaMINE (LUVOX) 100 MG tablet Take 1  tablet (100 mg total) by mouth at bedtime. 04/03/21   Crecencio Mc, MD  loratadine (CLARITIN) 10 MG tablet Take 10 mg by mouth daily.    [provider]  Melatonin 5 MG TABS Take 1 tablet by mouth at bedtime.    [provider]  Multiple Vitamin (MULTIVITAMIN) tablet Take 1 tablet by mouth daily.    [provider]  QUEtiapine (SEROQUEL) 25 MG tablet Take 1 tablet (25 mg total) by mouth 2 (two) times daily. 04/03/21   Crecencio Mc, MD  Syringe/Needle, Disp, (SYRINGE 3CC/25GX1") 25G X 1" 3  ML MISC Use for b12 injections 02/08/21   Crecencio Mc, MD  vitamin B-12 (CYANOCOBALAMIN) 1000 MCG tablet Take 1,000 mcg by mouth daily.    [provider]    Allergies Morphine and related  Family History  Problem Relation Age of Onset   Cancer Mother    Heart attack Father    Alcohol abuse Father    Alzheimer's disease Sister    Cerebral aneurysm Brother    Hypertension Daughter    Hypertension Son    Hypertension Daughter     Social History Social History   Tobacco Use   Smoking status: Former   Smokeless tobacco: Never  Scientific laboratory technician Use: Never used  Substance Use Topics   Alcohol use: No   Drug use: No    Review of Systems Level 5 caveat secondary to dementia  ____________________________________________   PHYSICAL EXAM:  VITAL SIGNS: ED Triage Vitals  Enc Vitals Group     BP 04/23/21 1823 (!) 148/65     Pulse Rate 04/23/21 1823 66     Resp 04/23/21 1823 18     Temp 04/23/21 1823 98.4 F (36.9 C)     Temp Source 04/23/21 1823 Oral     SpO2 04/23/21 1823 99 %     Weight --      Height --      Head Circumference --      Peak Flow --      Pain Score 04/23/21 1827 0     Pain Loc --      Pain Edu? --      Excl. in GC? --    CONSTITUTIONAL: Alert and oriented person and place but not time.  Demented.  In no distress.  Afebrile, elderly, nontoxic. HEAD: Normocephalic, atraumatic EYES: Conjunctivae clear, pupils appear equal, EOM appear intact ENT: normal nose; moist mucous membranes NECK: Supple, normal ROM, no midline spinal tenderness or step-off or deformity CARD: RRR; S1 and S2 appreciated; no murmurs, no clicks, no rubs, no gallops RESP: Normal chest excursion without splinting or tachypnea; breath sounds clear and equal bilaterally; no wheezes, no rhonchi, no rales, no hypoxia or respiratory distress, speaking full sentences ABD/GI: Normal bowel sounds; non-distended; soft, non-tender, no rebound, no guarding, no peritoneal signs,  no hepatosplenomegaly BACK: The back appears normal, no midline spinal tenderness or step-off or deformity EXT: Normal ROM in all joints; no deformity noted, no edema; no cyanosis SKIN: Normal color for age and race; warm; no rash on exposed skin NEURO: Moves all extremities equally, no drift, cranial nerves II through XII intact, reports normal sensation diffusely but difficult to truly assess due to her confusion, normal speech PSYCH: The patient's mood and manner are appropriate.  ____________________________________________   LABS (all labs ordered are listed, but only abnormal results are displayed)  Labs Reviewed  COMPREHENSIVE METABOLIC PANEL - Abnormal; Notable for the  following components:      Result Value   Calcium 8.5 (*)    All other components within normal limits  URINALYSIS, ROUTINE W REFLEX MICROSCOPIC - Abnormal; Notable for the following components:   Color, Urine STRAW (*)    APPearance CLEAR (*)    Specific Gravity, Urine 1.003 (*)    Leukocytes,Ua SMALL (*)    Bacteria, UA RARE (*)    All other components within normal limits  RESP PANEL BY RT-PCR (FLU A&B, COVID) ARPGX2  URINE CULTURE  CBC  DIFFERENTIAL  TROPONIN I (HIGH SENSITIVITY)   ____________________________________________  EKG   EKG Interpretation  Date/Time:  Monday April 23 2021 18:30:57 EDT Ventricular Rate:  65 PR Interval:  150 QRS Duration: 80 QT Interval:  446 QTC Calculation: 463 R Axis:   -26 Text Interpretation: Normal sinus rhythm Nonspecific ST and T wave abnormality Abnormal ECG Confirmed by Pryor Curia [phone]) on 04/24/2021 12:38:21 AM        ____________________________________________  RADIOLOGY Jessie Foot Rosa Wyly, personally viewed and evaluated these images (plain radiographs) as part of my medical decision making, as well as reviewing the written report by the radiologist.  ED MD interpretation: CT head shows no acute abnormality.  Official radiology  report(s): CT HEAD WO CONTRAST  Result Date: 04/23/2021 CLINICAL DATA:  Altered mental status. EXAM: CT HEAD WITHOUT CONTRAST TECHNIQUE: Contiguous axial images were obtained from the base of the skull through the vertex without intravenous contrast. COMPARISON:  None. FINDINGS: Brain: There is mild cerebral atrophy with widening of the extra-axial spaces and ventricular dilatation. There are areas of decreased attenuation within the white matter tracts of the supratentorial brain, consistent with microvascular disease changes. 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 Vascular: No hyperdense vessel or unexpected calcification. Skull: Normal. Negative for fracture or focal lesion. Sinuses/Orbits: No acute finding. Other: None. IMPRESSION: 1. Generalized cerebral atrophy. 2. No acute intracranial abnormality. Electronically Signed   By: Virgina Norfolk M.D.   On: 04/23/2021 19:34    ____________________________________________   PROCEDURES  Procedure(s) performed (including Critical Care):  Procedures    ____________________________________________   INITIAL IMPRESSION / ASSESSMENT AND PLAN / ED COURSE  As part of my medical decision making, I reviewed the following data within the [place] History obtained from family, Nursing notes reviewed and incorporated, Labs reviewed , EKG interpreted , Old EKG reviewed, Discussed with admitting physician , CT reviewed, and Notes from prior ED visits         Patient here with worsening altered mental status over the past couple of weeks.  Is on treatment for urinary tract infection.  Daughter states that she feels the patient needs to be placed into a nursing facility.  Labs, EKG, CT head obtained from triage show no acute abnormality.  No focal neurologic deficits other than confusion to suggest stroke.  No significant electrolyte derangement, anemia, dehydration on work-up thus far.  Urine pending.  Patient  will likely need admission.  Discussed with daughter that this could also be worsening of her dementia especially in the setting of a recent UTI.  ED PROGRESS  Patient's urine shows small leukocytes, 6-10 white blood cells and rare bacteria.  This does appear somewhat improved compared to previous urine.  We do not have any urine cultures in our system unfortunately.  Will give IV Rocephin and discussed with hospitalist for admission.  I suspect UTI is worsening her dementia.  Family would like placement.  2:10  AM Discussed patient's case with hospitalist, Dr. Damita Dunnings.  I have recommended admission and patient (and family if present) agree with this plan. Admitting physician will place admission orders.   I reviewed all nursing notes, vitals, pertinent previous records and reviewed/interpreted all EKGs, lab and urine results, imaging (as available).  ____________________________________________   FINAL CLINICAL IMPRESSION(S) / ED DIAGNOSES  Final diagnoses:  Altered mental status, unspecified altered mental status type  Acute UTI     ED Discharge Orders     None       *Please note:  Chatara Lucente was evaluated in Emergency Department on 04/24/2021 for the symptoms described in the history of present illness. She was evaluated in the context of the global COVID-19 pandemic, which necessitated consideration that the patient might be at risk for infection with the SARS-CoV-2 virus that causes COVID-19. Institutional protocols and algorithms that pertain to the evaluation of patients at risk for COVID-19 are in a state of rapid change based on information released by regulatory bodies including the CDC and federal and state organizations. These policies and algorithms were followed during the patient's care in the ED.  Some ED evaluations and interventions may be delayed as a result of limited staffing during and the pandemic.*   Note:  This document was prepared using Dragon voice recognition  software and may include unintentional dictation errors.    Iola Turri, Delice Bison, DO 04/24/21 0210

## 2021-04-23 NOTE — Telephone Encounter (Signed)
Patient continuing to have symptoms of right sided weakness , cannot lift her right foot like the other foot and when she tries to step she falls to the right side.  Had daughter to have patient smile , daughter says patient smile is equal. Right hand grip weaker to daughter than left.Patient daughter says she cannot recognize her home , she cannot find her bedroom,  or cannot find bathroom has had multiple falls.  I advised patient needs to re-evaluated at the ED immediately if this is not patient normal , daughter advised she is not normally like this only since the DX of UTI on 04/14/21.  Daughter will follow advice take patient to ED now.

## 2021-04-23 NOTE — Telephone Encounter (Signed)
Daughter calling in. States the Patient was referred to a kidney specialist, she has been seen and scheduled for an Ultrasound.  Patient was transported to hospital via EMS 2 weeks ago and was diagnosed with a UTI and sepsis.  Daughter thinks the UTI is gone due to urinary odor being gone. However the Patient can not walk well and daughter thinks something else is going on.  Patient states she can barley pick her feet up.   Patient is fatigued, wanting to sleep all day,only wanting to shower maybe once weekly, weak on the right side. Patient is also confused, have been living with her daughter for 5 years but has been asking them so they live there and is she spending the night. Symptoms ongoing since going to the hospital.   Please advise

## 2021-04-23 NOTE — ED Triage Notes (Addendum)
Pt comes with increased weakness and AMS per family. Family states she needs to be evaluated for stroke per MD bc of this.  Pt was recently here in hospital for UTI and sepsis.  Pt disoriented to place, pt is A*O to person. Family reports pt did have unwitnessed fall last night.  Mini neuro neg no weakness or drift noted

## 2021-04-24 ENCOUNTER — Inpatient Hospital Stay: Payer: Medicare Other

## 2021-04-24 DIAGNOSIS — R262 Difficulty in walking, not elsewhere classified: Secondary | ICD-10-CM | POA: Diagnosis present

## 2021-04-24 DIAGNOSIS — Z885 Allergy status to narcotic agent status: Secondary | ICD-10-CM | POA: Diagnosis not present

## 2021-04-24 DIAGNOSIS — Z79899 Other long term (current) drug therapy: Secondary | ICD-10-CM | POA: Diagnosis not present

## 2021-04-24 DIAGNOSIS — I10 Essential (primary) hypertension: Secondary | ICD-10-CM | POA: Diagnosis present

## 2021-04-24 DIAGNOSIS — Z8249 Family history of ischemic heart disease and other diseases of the circulatory system: Secondary | ICD-10-CM | POA: Diagnosis not present

## 2021-04-24 DIAGNOSIS — G9340 Encephalopathy, unspecified: Secondary | ICD-10-CM | POA: Diagnosis present

## 2021-04-24 DIAGNOSIS — R1310 Dysphagia, unspecified: Secondary | ICD-10-CM | POA: Diagnosis present

## 2021-04-24 DIAGNOSIS — F02818 Dementia in other diseases classified elsewhere, unspecified severity, with other behavioral disturbance: Secondary | ICD-10-CM | POA: Diagnosis present

## 2021-04-24 DIAGNOSIS — G319 Degenerative disease of nervous system, unspecified: Secondary | ICD-10-CM | POA: Diagnosis not present

## 2021-04-24 DIAGNOSIS — Z811 Family history of alcohol abuse and dependence: Secondary | ICD-10-CM | POA: Diagnosis not present

## 2021-04-24 DIAGNOSIS — R531 Weakness: Secondary | ICD-10-CM | POA: Diagnosis present

## 2021-04-24 DIAGNOSIS — N390 Urinary tract infection, site not specified: Secondary | ICD-10-CM

## 2021-04-24 DIAGNOSIS — E876 Hypokalemia: Secondary | ICD-10-CM | POA: Diagnosis present

## 2021-04-24 DIAGNOSIS — R4182 Altered mental status, unspecified: Secondary | ICD-10-CM | POA: Diagnosis not present

## 2021-04-24 DIAGNOSIS — Z7401 Bed confinement status: Secondary | ICD-10-CM | POA: Diagnosis not present

## 2021-04-24 DIAGNOSIS — R41841 Cognitive communication deficit: Secondary | ICD-10-CM | POA: Diagnosis present

## 2021-04-24 DIAGNOSIS — E210 Primary hyperparathyroidism: Secondary | ICD-10-CM | POA: Diagnosis present

## 2021-04-24 DIAGNOSIS — J439 Emphysema, unspecified: Secondary | ICD-10-CM | POA: Diagnosis present

## 2021-04-24 DIAGNOSIS — M6281 Muscle weakness (generalized): Secondary | ICD-10-CM | POA: Diagnosis present

## 2021-04-24 DIAGNOSIS — J302 Other seasonal allergic rhinitis: Secondary | ICD-10-CM | POA: Diagnosis present

## 2021-04-24 DIAGNOSIS — R5383 Other fatigue: Secondary | ICD-10-CM | POA: Diagnosis present

## 2021-04-24 DIAGNOSIS — G4700 Insomnia, unspecified: Secondary | ICD-10-CM | POA: Diagnosis present

## 2021-04-24 DIAGNOSIS — G309 Alzheimer's disease, unspecified: Secondary | ICD-10-CM | POA: Diagnosis present

## 2021-04-24 DIAGNOSIS — Z741 Need for assistance with personal care: Secondary | ICD-10-CM | POA: Diagnosis present

## 2021-04-24 DIAGNOSIS — F339 Major depressive disorder, recurrent, unspecified: Secondary | ICD-10-CM | POA: Diagnosis present

## 2021-04-24 DIAGNOSIS — Z820 Family history of epilepsy and other diseases of the nervous system: Secondary | ICD-10-CM | POA: Diagnosis not present

## 2021-04-24 DIAGNOSIS — R404 Transient alteration of awareness: Secondary | ICD-10-CM | POA: Diagnosis not present

## 2021-04-24 DIAGNOSIS — Z9181 History of falling: Secondary | ICD-10-CM | POA: Diagnosis not present

## 2021-04-24 DIAGNOSIS — G9341 Metabolic encephalopathy: Secondary | ICD-10-CM

## 2021-04-24 DIAGNOSIS — I700 Atherosclerosis of aorta: Secondary | ICD-10-CM | POA: Diagnosis present

## 2021-04-24 DIAGNOSIS — R0902 Hypoxemia: Secondary | ICD-10-CM | POA: Diagnosis not present

## 2021-04-24 DIAGNOSIS — Z20822 Contact with and (suspected) exposure to covid-19: Secondary | ICD-10-CM | POA: Diagnosis present

## 2021-04-24 LAB — URINALYSIS, ROUTINE W REFLEX MICROSCOPIC
Bilirubin Urine: NEGATIVE
Glucose, UA: NEGATIVE mg/dL
Hgb urine dipstick: NEGATIVE
Ketones, ur: NEGATIVE mg/dL
Nitrite: NEGATIVE
Protein, ur: NEGATIVE mg/dL
Specific Gravity, Urine: 1.003 — ABNORMAL LOW (ref 1.005–1.030)
pH: 6 (ref 5.0–8.0)

## 2021-04-24 LAB — CREATININE, SERUM
Creatinine, Ser: 0.76 mg/dL (ref 0.44–1.00)
GFR, Estimated: >60 mL/min (ref >60)

## 2021-04-24 LAB — CBC
HCT: 38.9 % (ref 36.0–46.0)
Hemoglobin: 13.5 g/dL (ref 12.0–15.0)
MCH: 31.5 pg (ref 26.0–34.0)
MCHC: 34.7 g/dL (ref 30.0–36.0)
MCV: 90.9 fL (ref 80.0–100.0)
Platelets: 234 10*3/uL (ref 150–400)
RBC: 4.28 MIL/uL (ref 3.87–5.11)
RDW: 13.9 % (ref 11.5–15.5)
WBC: 7.7 10*3/uL (ref 4.0–10.5)
nRBC: 0 % (ref 0.0–0.2)

## 2021-04-24 LAB — URINE CULTURE

## 2021-04-24 LAB — RESP PANEL BY RT-PCR (FLU A&B, COVID) ARPGX2
Influenza A by PCR: NEGATIVE
Influenza B by PCR: NEGATIVE
SARS Coronavirus 2 by RT PCR: NEGATIVE

## 2021-04-24 MED ORDER — FLUVOXAMINE MALEATE 50 MG PO TABS
100.0000 mg | ORAL_TABLET | Freq: Every day | ORAL | Status: DC
  Administered 2021-04-24 – 2021-04-26 (×3): 100 mg via ORAL
  Filled 2021-04-24 (×5): qty 2

## 2021-04-24 MED ORDER — ESCITALOPRAM OXALATE 10 MG PO TABS
5.0000 mg | ORAL_TABLET | Freq: Every day | ORAL | Status: DC
  Administered 2021-04-24 – 2021-04-26 (×3): 5 mg via ORAL
  Filled 2021-04-24 (×4): qty 0.5

## 2021-04-24 MED ORDER — ONDANSETRON HCL 4 MG PO TABS: 4.0000 mg | ORAL_TABLET | Freq: Four times a day (QID) | ORAL | Status: DC | PRN

## 2021-04-24 MED ORDER — MELATONIN 5 MG PO TABS
5.0000 mg | ORAL_TABLET | Freq: Every evening | ORAL | Status: DC | PRN
  Administered 2021-04-24 – 2021-04-26 (×2): 5 mg via ORAL
  Filled 2021-04-24 (×3): qty 1

## 2021-04-24 MED ORDER — ONDANSETRON HCL 4 MG/2ML IJ SOLN: 4.0000 mg | Freq: Four times a day (QID) | INTRAMUSCULAR | Status: DC | PRN

## 2021-04-24 MED ORDER — ACETAMINOPHEN 325 MG PO TABS: 650.0000 mg | ORAL_TABLET | Freq: Four times a day (QID) | ORAL | Status: DC | PRN

## 2021-04-24 MED ORDER — ACETAMINOPHEN 650 MG RE SUPP: 650.0000 mg | Freq: Four times a day (QID) | RECTAL | Status: DC | PRN

## 2021-04-24 MED ORDER — SODIUM CHLORIDE 0.9 % IV SOLN
1.0000 g | INTRAVENOUS | Status: DC
  Administered 2021-04-25 (×2): 1 g via INTRAVENOUS
  Filled 2021-04-24 (×3): qty 10
  Filled 2021-04-24: qty 1

## 2021-04-24 MED ORDER — ENOXAPARIN SODIUM 40 MG/0.4ML IJ SOSY
40.0000 mg | PREFILLED_SYRINGE | INTRAMUSCULAR | Status: DC
  Administered 2021-04-24 – 2021-04-26 (×3): 40 mg via SUBCUTANEOUS
  Filled 2021-04-24 (×3): qty 0.4

## 2021-04-24 MED ORDER — SODIUM CHLORIDE 0.9 % IV SOLN
1.0000 g | Freq: Once | INTRAVENOUS | Status: AC
  Administered 2021-04-24: 1 g via INTRAVENOUS
  Filled 2021-04-24: qty 10

## 2021-04-24 MED ORDER — AMLODIPINE BESYLATE 5 MG PO TABS
5.0000 mg | ORAL_TABLET | Freq: Every day | ORAL | Status: DC
  Administered 2021-04-24 – 2021-04-26 (×3): 5 mg via ORAL
  Filled 2021-04-24 (×3): qty 1

## 2021-04-24 MED ORDER — LACTATED RINGERS IV SOLN: INTRAVENOUS | Status: DC

## 2021-04-24 MED ORDER — QUETIAPINE FUMARATE 25 MG PO TABS
25.0000 mg | ORAL_TABLET | Freq: Two times a day (BID) | ORAL | Status: DC
  Administered 2021-04-24 – 2021-04-27 (×7): 25 mg via ORAL
  Filled 2021-04-24 (×7): qty 1

## 2021-04-24 MED ORDER — DIVALPROEX SODIUM ER 500 MG PO TB24
500.0000 mg | ORAL_TABLET | Freq: Every day | ORAL | Status: DC
  Administered 2021-04-24 – 2021-04-26 (×3): 500 mg via ORAL
  Filled 2021-04-24 (×4): qty 1

## 2021-04-24 NOTE — ED Notes (Signed)
Pt yelling, "help me" this RN at bedside. Pt is confused and sitting forward in the bed stating she does not want to be here. Explained she would spending the night with [place]. Bed alarm placed on pt. Delsa Sale, Primary RN notified.

## 2021-04-24 NOTE — Progress Notes (Signed)
PT Cancellation Note  Patient Details Name: Sultana Tierney MRN: [id number] DOB: 1941/04/04   Cancelled Treatment:    Reason Eval/Treat Not Completed: Patient declined, no reason specified Spoke with OT and nursing pre-session, both voice concerns about something with R LE.  Appears she has been either hesitant to put weight on R or simply struggling to coordinate steppage?  On arrival to ED nursing reports that pt asleep for the first time in many (>24?) hours, PT did attempt to eval her ~1 hour after sleeping... She woke but was clearly confused.  She was able to state her first name but no other orientation questions.  She called out for family to help her get ready, etc - PT tried to explain her situation and the plan to assess her mobility, strength, etc and she was not at all "in to it."  She final just stopped her tangential comments and said "Not now, just let me sleep" and closed her eyes.  Instructed that we will have to do PT exam tomorrow, with little response - PT respected pt's wishes to sleep and exited room.  Will try back tomorrow as pt is able and appropriate.  Kreg Shropshire, DPT 04/24/2021, 3:31 PM

## 2021-04-24 NOTE — ED Notes (Signed)
Sitter remains at bedside, assisted to bathroom via walker and 1 assist.

## 2021-04-24 NOTE — H&P (Signed)
History and Physical    Chara Marquard BBC:[id number] DOB: 03-13-1941 DOA: 04/23/2021  PCP: Crecencio Mc, MD   Patient coming from: home  I have personally briefly reviewed patient's old medical records in [place]  Chief Complaint: altered mental status  HPI: Felicia Anderson is a 80 y.o. female with medical history significant for HTN and dementia who lives with daughter and son-in-law and who recently completed a course of Keflex for UTI who was brought in because of persistent confusion and a fall the night prior raising concerns for continued UTI.  This confusion is outside her baseline for dementia.  She has had no fever or chills, vomiting or diarrhea.  Foul urinary odor from recent UTI has cleared.  ED course: Vitals within normal limits Blood work unremarkable which included CBC, CMP and troponin Urinalysis with small leukocyte esterase and rare bacteria  EKG, personally viewed and interpreted: Sinus at 65 with no acute ST-T wave changes  Imaging: CT head with generalized cerebral atrophy and no acute intracranial abnormality  Patient started on Rocephin.  Hospitalist consulted for admission.  Review of Systems: Unable to obtain due to confusion   Past Medical History:  Diagnosis Date   Hypertension    Small bowel obstruction due to adhesions [facility])     Past Surgical History:  Procedure Laterality Date   BOWEL RESECTION     NASAL SEPTUM SURGERY     TUBAL LIGATION  1969     reports that she has quit smoking. She has never used smokeless tobacco. She reports that she does not drink alcohol and does not use drugs.  Allergies  Allergen Reactions   Morphine And Related Other (See Comments)    Hallucinations    Family History  Problem Relation Age of Onset   Cancer Mother    Heart attack Father    Alcohol abuse Father    Alzheimer's disease Sister    Cerebral aneurysm Brother    Hypertension Daughter    Hypertension Son    Hypertension Daughter        Prior to Admission medications   Medication Sig Start Date End Date Taking? Authorizing Provider  amLODipine (NORVASC) 5 MG tablet TAKE 1 TABLET (5 MG TOTAL) BY MOUTH DAILY. 02/26/21   Crecencio Mc, MD  B Complex Vitamins (VITAMIN B COMPLEX PO) Take by mouth.    [provider]  cephALEXin (KEFLEX) 500 MG capsule Take 1 capsule (500 mg total) by mouth 4 (four) times daily for 10 days. 04/14/21 04/24/21  Cuthriell, Charline Bills, PA-C  Cholecalciferol (VITAMIN D3) 100000 UNIT/GM POWD Take by mouth.    [provider]  cyanocobalamin (,VITAMIN B-12,) 1000 MCG/ML injection Inject 1 mL (1,000 mcg total) into the muscle once a week. 02/08/21   Crecencio Mc, MD  divalproex (DEPAKOTE ER) 250 MG 24 hr tablet Take 2 tablets (500 mg total) by mouth at bedtime. 04/03/21   Crecencio Mc, MD  escitalopram (LEXAPRO) 5 MG tablet Take 5 mg by mouth at bedtime.    [provider]  fluvoxaMINE (LUVOX) 100 MG tablet Take 1 tablet (100 mg total) by mouth at bedtime. 04/03/21   Crecencio Mc, MD  loratadine (CLARITIN) 10 MG tablet Take 10 mg by mouth daily.    [provider]  Melatonin 5 MG TABS Take 1 tablet by mouth at bedtime.    [provider]  Multiple Vitamin (MULTIVITAMIN) tablet Take 1 tablet by mouth daily.    [provider]  QUEtiapine (SEROQUEL) 25 MG tablet Take 1 tablet (25 mg total) by mouth 2 (two) times daily. 04/03/21   Crecencio Mc, MD  Syringe/Needle, Disp, (SYRINGE 3CC/25GX1") 25G X 1" 3 ML MISC Use for b12 injections 02/08/21   Crecencio Mc, MD  vitamin B-12 (CYANOCOBALAMIN) 1000 MCG tablet Take 1,000 mcg by mouth daily.    [provider]    Physical Exam: Vitals:   04/24/21 0000 04/24/21 0023 04/24/21 0030 04/24/21 0115  BP: (!) 144/65  (!) 154/82 (!) 164/80  Pulse: (!) 55  (!) 59 64  Resp:      Temp:      TempSrc:      SpO2: 96%  97% 94%  Weight:  68.5 kg    Height:  5\' 1"  (1.549 m)       Vitals:    04/24/21 0000 04/24/21 0023 04/24/21 0030 04/24/21 0115  BP: (!) 144/65  (!) 154/82 (!) 164/80  Pulse: (!) 55  (!) 59 64  Resp:      Temp:      TempSrc:      SpO2: 96%  97% 94%  Weight:  68.5 kg    Height:  5\' 1"  (1.549 m)        Constitutional: elderly female, Alert and oriented x 1 . Not in any apparent distress HEENT:      Head: Normocephalic and atraumatic.         Eyes: PERLA, EOMI, Conjunctivae are normal. Sclera is non-icteric.       Mouth/Throat: Mucous membranes are moist.       Neck: Supple with no signs of meningismus. Cardiovascular: Regular rate and rhythm. No murmurs, gallops, or rubs. 2+ symmetrical distal pulses are present . No JVD. No LE edema Respiratory: Respiratory effort normal .Lungs sounds clear bilaterally. No wheezes, crackles, or rhonchi.  Gastrointestinal: Soft, non tender, and non distended with positive bowel sounds.  Genitourinary: No CVA tenderness. Musculoskeletal: Nontender with normal range of motion in all extremities. No cyanosis, or erythema of extremities. Neurologic:  Face is symmetric. Moving all extremities. No gross focal neurologic deficits . Skin: Skin is warm, dry.  No rash or ulcers Psychiatric: Mood and affect are normal    Labs on Admission: I have personally reviewed following labs and imaging studies  CBC: Recent Labs  Lab 04/23/21 1830  WBC 7.7  NEUTROABS 5.6  HGB 13.4  HCT 38.1  MCV 91.4  PLT 774   Basic Metabolic Panel: Recent Labs  Lab 04/23/21 1830  NA 138  K 4.1  CL 102  CO2 26  GLUCOSE 99  BUN 20  CREATININE 0.95  CALCIUM 8.5*   GFR: Estimated Creatinine Clearance: 43.2 mL/min (by C-G formula based on SCr of 0.95 mg/dL). Liver Function Tests: Recent Labs  Lab 04/23/21 1830  AST 35  ALT 27  ALKPHOS 52  BILITOT 1.0  PROT 7.0  ALBUMIN 3.7   No results for input(s): LIPASE, AMYLASE in the last 168 hours. No results for input(s): AMMONIA in the last 168 hours. Coagulation Profile: No results  for input(s): INR, PROTIME in the last 168 hours. Cardiac Enzymes: No results for input(s): CKTOTAL, CKMB, CKMBINDEX, TROPONINI in the last 168 hours. BNP (last 3 results) No results for input(s): PROBNP in the last 8760 hours. HbA1C: No results for input(s): HGBA1C in the last 72 hours. CBG: No results for input(s): GLUCAP in the last 168 hours. Lipid Profile: No results for input(s): CHOL, HDL, LDLCALC, TRIG, CHOLHDL,  LDLDIRECT in the last 72 hours. Thyroid Function Tests: No results for input(s): TSH, T4TOTAL, FREET4, T3FREE, THYROIDAB in the last 72 hours. Anemia Panel: No results for input(s): VITAMINB12, FOLATE, FERRITIN, TIBC, IRON, RETICCTPCT in the last 72 hours. Urine analysis:    Component Value Date/Time   COLORURINE STRAW (A) 04/23/2021 2357   APPEARANCEUR CLEAR (A) 04/23/2021 2357   LABSPEC 1.003 (L) 04/23/2021 2357   PHURINE 6.0 04/23/2021 2357   GLUCOSEU NEGATIVE 04/23/2021 2357   GLUCOSEU NEGATIVE 01/05/2020 1559   HGBUR NEGATIVE 04/23/2021 2357   BILIRUBINUR NEGATIVE 04/23/2021 2357   [place] 04/23/2021 2357   PROTEINUR NEGATIVE 04/23/2021 2357   UROBILINOGEN 0.2 01/05/2020 1559   NITRITE NEGATIVE 04/23/2021 2357   LEUKOCYTESUR SMALL (A) 04/23/2021 2357    Radiological Exams on Admission: CT HEAD WO CONTRAST  Result Date: 04/23/2021 CLINICAL DATA:  Altered mental status. EXAM: CT HEAD WITHOUT CONTRAST TECHNIQUE: Contiguous axial images were obtained from the base of the skull through the vertex without intravenous contrast. COMPARISON:  None. FINDINGS: Brain: There is mild cerebral atrophy with widening of the extra-axial spaces and ventricular dilatation. There are areas of decreased attenuation within the white matter tracts of the supratentorial brain, consistent with microvascular disease changes. 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 Vascular: No hyperdense vessel or unexpected calcification. Skull: Normal. Negative for  fracture or focal lesion. Sinuses/Orbits: No acute finding. Other: None. IMPRESSION: 1. Generalized cerebral atrophy. 2. No acute intracranial abnormality. Electronically Signed   By: Virgina Norfolk M.D.   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

## 2021-04-24 NOTE — ED Notes (Signed)
P is very confused, Bed alarm in place, reoriented prn

## 2021-04-24 NOTE — ED Notes (Signed)
Very confused. Up in recliner chair but continues to try to get up. Safety sitter to bedside. With a person present she is calm and happy cooperative

## 2021-04-24 NOTE — Progress Notes (Signed)
Patient admitted to the hospital earlier this morning by Dr. Damita Dunnings  Patient seen and examined, currently pleasantly confused, lying in bed.  Appears calm  Assessment/plan  Worsening confusion.  Patient has known dementia for the past few years her daughter describes progressive worsening over the past few months, which appears to be worse in the past week.  History treated for possible UTI, per suspect that this is simply progression of her dementia.  Right leg weakness -Daughter feels that her right leg has been particularly weak and she had difficulty ambulating -CT head was negative -MRI brain  UTI -Currently on Rocephin -Discontinue if cultures negative  Disposition -We will need placement -TOC consult  Please refer to H&P done earlier today by Dr. Damita Dunnings for further plan

## 2021-04-24 NOTE — Evaluation (Signed)
Occupational Therapy Evaluation Patient Details Name: Felicia Anderson MRN: [id number] DOB: [birth year] Today's Date: 04/24/2021   History of Present Illness 80 y.o. female with medical history significant for HTN and dementia who lives with daughter and son-in-law and who recently completed a course of Keflex for UTI who was brought in because of persistent confusion, fall the night prior raising, and concerns R-sided weakness.  Family reports her confusion is outside her baseline for dementia.  CT head negative for acute intracranial abnormality.   Clinical Impression   Pt seen for OT evaluation this date. Upon arrival to room, pt supine in bed with sitter present. Pt A&Ox1 and reporting no pain. Pt pleasantly confused this date; PLOF/home set-up obtained via phone call with family d/t pt's impaired cognition. Family reporting pt with increased weakness over past 2 months, most recently receiving MIN A for sit>stand transfers, CGA for walking (without AD), SET-UP assist for UB dressing, MIN A for LB dressing, and MIN A for navigating the stairs to her bedroom on second floor. Family endorses pt has had multiple falls within the past couple months, even with HHPT services. Compared to baseline, pt currently presents with decreased balance, impaired cognition, and decreased coordination. Due to these functional impairments, pt requires intermittent MOD A for sitting balance at EOB, MAX A for LB dressing, SUPERVISION/SET-UP to wash/dry face while seated in chair, and MOD A for sit<>stand transfers. Of note, pt reporting significant fatigue after taking small shuffled steps bed<>chair (~30ft) with RW and MIN A (vitals WNL). Due to pt's decreased balance, decreased strength, and impaired cognition, pt is unsafe to return home at this time. Recommend STR to maximize independence with ADLs/functional mobility and minimize risk of future falls, injury, caregiver burden, and readmission. Upon discharge, recommend SNF.       Recommendations for follow up therapy are one component of a multi-disciplinary discharge planning process, led by the attending physician.  Recommendations may be updated based on patient status, additional functional criteria and insurance authorization.   Follow Up Recommendations  SNF    Equipment Recommendations  3 in 1 bedside commode;Tub/shower seat;Other (comment) (2ww)       Precautions / Restrictions Precautions Precautions: Fall Restrictions Weight Bearing Restrictions: No      Mobility Bed Mobility Overal bed mobility: Needs Assistance Bed Mobility: Supine to Sit;Sit to Supine     Supine to sit: Mod assist Sit to supine: Mod assist   General bed mobility comments: MOD A for trunk support supine>sit. MOD A for LE management during sit>supine    Transfers Overall transfer level: Needs assistance Equipment used: Rolling walker (2 wheeled) Transfers: Sit to/from Stand Sit to Stand: Min assist;From elevated surface;Mod assist         General transfer comment: MIN A from elevated ED bed. MOD A from chair    Balance Overall balance assessment: Needs assistance Sitting-balance support: Bilateral upper extremity supported;Feet unsupported Sitting balance-Leahy Scale: Poor Sitting balance - Comments: Intermittent posterior lean, requiring MOD A to regain static sitting balance.   Standing balance support: Bilateral upper extremity supported;During functional activity Standing balance-Leahy Scale: Poor Standing balance comment: MIN A and UE support from RW to walk short household distances with RW                           ADL either performed or assessed with clinical judgement   ADL Overall ADL's : Needs assistance/impaired     Grooming: Wash/dry face;Supervision/safety;Set up;Sitting  Lower Body Dressing: Maximal assistance;Sitting/lateral leans Lower Body Dressing Details (indicate cue type and reason): MAX A to don/doff  socks in setting of poor dyanmic sitting balance             Functional mobility during ADLs: Minimal assistance;Cueing for safety;Rolling walker        Pertinent Vitals/Pain Pain Assessment: No/denies pain        Extremity/Trunk Assessment Upper Extremity Assessment Upper Extremity Assessment: Generalized weakness (difficult to formally assess in setting of impaired cognition however grossly at least 3/5 in all movements. Pt endorses some sensory differences between R and L UE however unable to elaborate)   Lower Extremity Assessment Lower Extremity Assessment: Generalized weakness          Cognition Arousal/Alertness: Awake/alert Behavior During Therapy: WFL for tasks assessed/performed Overall Cognitive Status: Impaired/Different from baseline Area of Impairment: Orientation;Attention;Memory;Following commands;Safety/judgement                 Orientation Level: Disoriented to;Place;Time;Situation Current Attention Level: Focused Memory: Decreased recall of precautions;Decreased short-term memory Following Commands: Follows one step commands inconsistently       General Comments: Pt pleasantly confused, A&Ox1. Per chart review, pt has dementia however is more oriented at baseline. Pt inconsistently following one-step commands, even following verbal and tactile cues.              Home Living Family/patient expects to be discharged to:: Private residence Living Arrangements: Children (lives with daughter and son-in-law) Available Help at Discharge: Family;Available 24 hours/day Type of Home: House Home Access: Stairs to enter [employer] of Steps: 2   Home Layout: Two level;Bed/bath upstairs Alternate Level Stairs-Number of Steps: 12 Alternate Level Stairs-Rails: Right Bathroom Shower/Tub: Tub/shower unit         Home Equipment: Walker - 4 wheels;Cane - single point;Grab bars - tub/shower   Additional Comments: PLOF and home set-up  obtatined via phone call with daughter      Prior Functioning/Environment Level of Independence: Needs assistance  Gait / Transfers Assistance Needed: Decreased balance and endurance over past 2 months, requiring MIN A for sit>stand transfers, CGA for walking (without AD), and MIN A for navigating the stairs. Pt has a rollator and SPC but does not use it. Family endorses multiple falls within the past couple months. Was receiving HHPT for past 6 weeks. No community mobility since Spring 2022 ADL's / Homemaking Assistance Needed: Family assist with LB dressing and showering. Requires verbal cues to initiate UB dressing. Family assists with all IADLs.            OT Problem List: Decreased strength;Decreased activity tolerance;Impaired balance (sitting and/or standing);Decreased cognition;Decreased safety awareness;Decreased knowledge of use of DME or AE      OT Treatment/Interventions: Self-care/ADL training;Therapeutic exercise;DME and/or AE instruction;Therapeutic activities;Patient/family education;Balance training    OT Goals(Current goals can be found in the care plan section) Acute Rehab OT Goals Patient Stated Goal: to be safer at home OT Goal Formulation: With family Time For Goal Achievement: 05/08/21 Potential to Achieve Goals: Good ADL Goals Pt Will Perform Grooming: with min guard assist;standing Pt Will Perform Upper Body Dressing: with set-up;with supervision;sitting Pt Will Transfer to Toilet: with min guard assist;ambulating;regular height toilet  OT Frequency: Min 1X/week    AM-PAC OT "6 Clicks" Daily Activity     Outcome Measure Help from another person eating meals?: None Help from another person taking care of personal grooming?: A Little Help from another person toileting, which includes using toliet, bedpan, or  urinal?: A Lot Help from another person bathing (including washing, rinsing, drying)?: A Lot Help from another person to put on and taking off regular  upper body clothing?: A Little Help from another person to put on and taking off regular lower body clothing?: A Lot 6 Click Score: 16   End of Session Equipment Utilized During Treatment: Gait belt;Rolling walker Nurse Communication: Mobility status  Activity Tolerance: Patient tolerated treatment well Patient left: in bed;with call bell/phone within reach;with nursing/sitter in room  OT Visit Diagnosis: Unsteadiness on feet (R26.81);Muscle weakness (generalized) (M62.81);Other symptoms and signs involving cognitive function;Repeated falls (R29.6)                Time: 5916-3846 OT Time Calculation (min): 27 min Charges:  OT General Charges $OT Visit: 1 Visit OT Evaluation $OT Eval Moderate Complexity: 1 Mod OT Treatments $Self Care/Home Management : 8-22 mins  Fredirick Maudlin, OTR/L [place]

## 2021-04-24 NOTE — ED Notes (Signed)
Continue to reorient and provide safety

## 2021-04-24 NOTE — ED Notes (Signed)
Pt bed alarm going off. Pt states she needs to go to the restroom. Pt ambulated to the restroom with 1 person assistance.

## 2021-04-25 ENCOUNTER — Encounter: Payer: Self-pay | Admitting: Internal Medicine

## 2021-04-25 DIAGNOSIS — E876 Hypokalemia: Secondary | ICD-10-CM

## 2021-04-25 DIAGNOSIS — F02818 Dementia in other diseases classified elsewhere, unspecified severity, with other behavioral disturbance: Secondary | ICD-10-CM

## 2021-04-25 DIAGNOSIS — G309 Alzheimer's disease, unspecified: Principal | ICD-10-CM

## 2021-04-25 LAB — CBC
HCT: 40.4 % (ref 36.0–46.0)
Hemoglobin: 13.4 g/dL (ref 12.0–15.0)
MCH: 30.9 pg (ref 26.0–34.0)
MCHC: 33.2 g/dL (ref 30.0–36.0)
MCV: 93.1 fL (ref 80.0–100.0)
Platelets: 206 10*3/uL (ref 150–400)
RBC: 4.34 MIL/uL (ref 3.87–5.11)
RDW: 14 % (ref 11.5–15.5)
WBC: 6.2 10*3/uL (ref 4.0–10.5)
nRBC: 0 % (ref 0.0–0.2)

## 2021-04-25 LAB — BASIC METABOLIC PANEL
Anion gap: 9 (ref 5–15)
BUN: 12 mg/dL (ref 8–23)
CO2: 27 mmol/L (ref 22–32)
Calcium: 8.2 mg/dL — ABNORMAL LOW (ref 8.9–10.3)
Chloride: 104 mmol/L (ref 98–111)
Creatinine, Ser: 0.88 mg/dL (ref 0.44–1.00)
GFR, Estimated: >60 mL/min (ref >60)
Glucose, Bld: 91 mg/dL (ref 70–99)
Potassium: 3.4 mmol/L — ABNORMAL LOW (ref 3.5–5.1)
Sodium: 140 mmol/L (ref 135–145)

## 2021-04-25 MED ORDER — POLYETHYLENE GLYCOL 3350 17 G PO PACK
17.0000 g | PACK | Freq: Every day | ORAL | Status: DC
  Administered 2021-04-25 – 2021-04-27 (×3): 17 g via ORAL
  Filled 2021-04-25 (×3): qty 1

## 2021-04-25 MED ORDER — POTASSIUM CHLORIDE CRYS ER 20 MEQ PO TBCR
20.0000 meq | EXTENDED_RELEASE_TABLET | Freq: Once | ORAL | Status: AC
  Administered 2021-04-25: 15:00:00 20 meq via ORAL
  Filled 2021-04-25: qty 1

## 2021-04-25 MED ORDER — DOCUSATE SODIUM 100 MG PO CAPS
200.0000 mg | ORAL_CAPSULE | Freq: Two times a day (BID) | ORAL | Status: DC
  Administered 2021-04-25 – 2021-04-27 (×4): 200 mg via ORAL
  Filled 2021-04-25 (×4): qty 2

## 2021-04-25 MED ORDER — HALOPERIDOL LACTATE 5 MG/ML IJ SOLN
0.5000 mg | INTRAMUSCULAR | Status: DC | PRN
Start: 2021-04-25 — End: 2021-04-26
  Administered 2021-04-25: 0.5 mg via INTRAVENOUS
  Filled 2021-04-25: qty 1

## 2021-04-25 MED ORDER — BISACODYL 10 MG RE SUPP
10.0000 mg | Freq: Every day | RECTAL | Status: DC | PRN
  Administered 2021-04-26: 10 mg via RECTAL
  Filled 2021-04-25: qty 1

## 2021-04-25 NOTE — TOC Initial Note (Signed)
Transition of Care [facility]) - Initial/Assessment Note    Patient Details  Name: Felicia Anderson MRN: [id number] Date of Birth: October 20, 1940  [facility] CM/SW Contact:    Shelbie Hutching, RN Phone Number: 04/25/2021, 1:30 PM  Clinical Narrative:                 Patient admitted to the hospital with altered mental status and UTI.  Patient is from home with her daughter, daughter reports that the patient is able to feed herself at home and get back and forth to the bathroom by her self but sometimes needs help getting up from sitting.  Patient has a cane at home but does not use it and they recently purchased a rollator.  Daughter would like for the patient to have long term placement.  RNCM discussed searching for short term rehab with plan to convert to long term care.  Patient has Medicare and Medicaid.  Patient has been very pleasant and agreeable to care and therapy today.    Expected Discharge Plan: Skilled Nursing Facility Barriers to Discharge: Continued Medical Work up   Patient Goals and CMS Choice Patient states their goals for this hospitalization and ongoing recovery are:: daughter wants rehab and then transition to LTC CMS Medicare.gov Compare Post Acute Care list provided to:: Patient Represenative (must comment) Choice offered to / list presented to : Adult Children  Expected Discharge Plan and Services Expected Discharge Plan: [place]   Discharge Planning Services: CM Consult Post Acute Care Choice: [place] Living arrangements for the past 2 months: Single Family Home                 DME Arranged: N/A DME Agency: NA       HH Arranged: NA HH Agency: NA        Prior Living Arrangements/Services Living arrangements for the past 2 months: Single Family Home Lives with:: Adult Children Patient language and need for interpreter reviewed:: Yes Do you feel safe going back to the place where you live?: Yes      Need for Family  Participation in Patient Care: Yes (Comment) Care giver support system in place?: Yes (comment) Current home services: DME (cane and rollator) Criminal Activity/Legal Involvement Pertinent to Current Situation/Hospitalization: No - Comment as needed  Activities of Daily Living Home Assistive Devices/Equipment: Cane (specify quad or straight), Dentures (specify type), Eyeglasses, Walker (specify type) ADL Screening (condition at time of admission) Patient's cognitive ability adequate to safely complete daily activities?: No Is the patient deaf or have difficulty hearing?: Yes Does the patient have difficulty seeing, even when wearing glasses/contacts?: No Does the patient have difficulty concentrating, remembering, or making decisions?: Yes Patient able to express need for assistance with ADLs?: Yes Does the patient have difficulty dressing or bathing?: Yes Independently performs ADLs?: No Communication: Independent Dressing (OT): Needs assistance Grooming: Needs assistance Feeding: Independent Bathing: Needs assistance Toileting: Needs assistance In/Out Bed: Needs assistance Walks in Home: Needs assistance Does the patient have difficulty walking or climbing stairs?: Yes Weakness of Legs: Both Weakness of Arms/Hands: None  Permission Sought/Granted Permission sought to share information with : Case Manager, Family Supports, Customer service manager Permission granted to share information with : Yes, Verbal Permission Granted  Share Information with NAME: Gunnar Fusi  Permission granted to share info w AGENCY: SNF's  Permission granted to share info w Relationship: daughter  Permission granted to share info w Contact Information: [phone]  Emotional Assessment Appearance:: Appears  stated age Attitude/Demeanor/Rapport: Other (comment) (happy, pleasant) Affect (typically observed): Calm, Happy, Pleasant Orientation: : Oriented to Self Alcohol / Substance Use: Not  Applicable Psych Involvement: No (comment)  Admission diagnosis:  UTI (urinary tract infection) [N39.0] Acute UTI [N39.0] Acute encephalopathy [G93.40] Altered mental status, unspecified altered mental status type [R41.82] Patient Active Problem List   Diagnosis Date Noted   Acute metabolic encephalopathy 89/79/1504   UTI (urinary tract infection) 04/24/2021   Acute encephalopathy 04/24/2021   Decreased calculated glomerular filtration rate (GFR) 03/16/2021   Emphysema lung ([place]) 02/12/2021   Thoracic aortic atherosclerosis ([place]) 02/12/2021   Generalized weakness 02/11/2021   Chest pain 02/11/2021   Primary hyperparathyroidism ([place]) 08/02/2020   Weight gain 07/09/2020   Hypocalcemia 07/07/2020   Educated about COVID-19 virus infection 11/08/2018   Breast cancer screening 04/22/2017   Screening for colon cancer 04/22/2017   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

## 2021-04-25 NOTE — Evaluation (Signed)
Physical Therapy Evaluation Patient Details Name: Felicia Anderson MRN: [id number] DOB: 10-11-1940 Today's Date: 04/25/2021  History of Present Illness  80 y.o. female with medical history significant for HTN and dementia who lives with daughter and son-in-law and who recently completed a course of Keflex for UTI who was brought in because of persistent confusion, fall the night prior raising, and concerns R-sided weakness.  Family reports her confusion is outside her baseline for dementia.  CT head negative for acute intracranial abnormality.  Clinical Impression  Pt confused t/o the session but did show good effort with mobility/ambulation tasks.  She was inconsistent with balance with occasional ability to maintain sitting EOB w/o assist and would then suddenly start leaning backward (near to supine w/o assist.)  Pt showed good effort when she comprehended what to do, however her cognition was inconsistent and she needed nearly constant cuing.  She did not appear to have much asymmetry with strength/coordination but showed discordant cadence, veering R&L and poor safety and general awareness.       Recommendations for follow up therapy are one component of a multi-disciplinary discharge planning process, led by the attending physician.  Recommendations may be updated based on patient status, additional functional criteria and insurance authorization.  Follow Up Recommendations SNF;Supervision/Assistance - 24 hour    Equipment Recommendations  None recommended by PT    Recommendations for Other Services       Precautions / Restrictions Precautions Precautions: Fall Restrictions Weight Bearing Restrictions: No      Mobility  Bed Mobility Overal bed mobility: Needs Assistance Bed Mobility: Sit to Supine     Supine to sit: Mod assist     General bed mobility comments: Pt made some effort with getting to EOB but struggled and could not shift weight forward or central enough to get to  sitting.  Mod assist, sustained, to attain sitting.    Transfers Overall transfer level: Needs assistance Equipment used: Rolling walker (2 wheeled) Transfers: Sit to/from Stand Sit to Stand: Mod assist         General transfer comment: Pt could not shift weight forward enough to attain standing even with min assist.  She was leaning R and back during the effort, even from raised bed, and needed constant assist.  Ambulation/Gait Ambulation/Gait assistance: Min assist Gait Distance (Feet): 100 Feet Assistive device: Rolling walker (2 wheeled)       General Gait Details: Pt was initially very unsteady and had numerous LOBs backward (needing mod assist to maintain standing), unable to shift weight forward onto walker.  Once PT provided direct assist to advance walker and keep her weight forward she did at least maintain upright.  She had discoordiant steppage that seemed roughly equal bilaterally, but was uncoordinated with inconsistent high stepping and foot flat shuffle.  Pt also had veering (both R and L) despite cuing and collided with stationary obstacles.  No LOBs, some subjective fatigue - unable to  [employer] Mobility    Modified Rankin (Stroke Patients Only)       Balance Overall balance assessment: Needs assistance Sitting-balance support: Bilateral upper extremity supported;Feet unsupported Sitting balance-Leahy Scale: Poor Sitting balance - Comments: Intermittent posterior lean, requiring MOD A to regain static sitting balance.   Standing balance support: Bilateral upper extremity supported;During functional activity Standing balance-Leahy Scale: Poor  Pertinent Vitals/Pain Pain Assessment: No/denies pain    Home Living Family/patient expects to be discharged to:: Private residence Living Arrangements: Children Available Help at Discharge: Family;Available 24 hours/day Type of Home: House Home  Access: Stairs to enter   [employer] of Steps: 2 Home Layout: Two level;Bed/bath upstairs Home Equipment: Walker - 4 wheels;Cane - single point;Grab bars - tub/shower Additional Comments: PLOF and set up obtained from prior documentation    Prior Function Level of Independence: Needs assistance   Gait / Transfers Assistance Needed: Decreased balance and endurance over past 2 months, requiring MIN A for sit>stand transfers, CGA for walking (without AD), and MIN A for navigating the stairs. Pt has a rollator and SPC but does not use it. Family endorses multiple falls within the past couple months. Was receiving HHPT for past 6 weeks. No community mobility since Spring 2022  ADL's / Homemaking Assistance Needed: Family assist with LB dressing and showering. Requires verbal cues to initiate UB dressing. Family assists with all IADLs.        Hand Dominance        Extremity/Trunk Assessment   Upper Extremity Assessment Upper Extremity Assessment: Overall WFL for tasks assessed;Generalized weakness    Lower Extremity Assessment Lower Extremity Assessment: Overall WFL for tasks assessed;Generalized weakness       Communication      Cognition Arousal/Alertness: Awake/alert Behavior During Therapy: Flat affect Overall Cognitive Status:  (h/o cognitive impairment - apparently more confused than even baseline)                                        General Comments General comments (skin integrity, edema, etc.): Pt pleasantly confused, but even with nearly constant cuing had safety issues that she was unable to correct w/o direct assist    Exercises     Assessment/Plan    PT Assessment Patient needs continued PT services  PT Problem List Decreased strength;Decreased range of motion;Decreased balance;Decreased mobility;Decreased activity tolerance;Decreased coordination;Decreased cognition;Decreased knowledge of use of DME;Decreased safety awareness        PT Treatment Interventions DME instruction;Gait training;Stair training;Functional mobility training;Therapeutic activities;Therapeutic exercise;Balance training;Cognitive remediation;Patient/family education    PT Goals (Current goals can be found in the Care Plan section)  Acute Rehab PT Goals PT Goal Formulation: Patient unable to participate in goal setting Time For Goal Achievement: 05/09/21 Potential to Achieve Goals: Good    Frequency Min 2X/week   Barriers to discharge        Co-evaluation               AM-PAC PT "6 Clicks" Mobility  Outcome Measure Help needed turning from your back to your side while in a flat bed without using bedrails?: A Little Help needed moving from lying on your back to sitting on the side of a flat bed without using bedrails?: A Lot Help needed moving to and from a bed to a chair (including a wheelchair)?: A Little Help needed standing up from a chair using your arms (e.g., wheelchair or bedside chair)?: A Little Help needed to walk in hospital room?: A Little Help needed climbing 3-5 steps with a railing? : Total 6 Click Score: 15    End of Session Equipment Utilized During Treatment: Gait belt Activity Tolerance: Patient limited by fatigue Patient left: in chair;with nursing/sitter in room;with call bell/phone within reach Nurse Communication: Mobility status PT Visit Diagnosis: Muscle  weakness (generalized) (M62.81);Difficulty in walking, not elsewhere classified (R26.2)    Time: 0623-7628 PT Time Calculation (min) (ACUTE ONLY): 27 min   Charges:   PT Evaluation $PT Eval Low Complexity: 1 Low PT Treatments $Gait Training: 8-22 mins        Kreg Shropshire, DPT 04/25/2021, 1:22 PM

## 2021-04-25 NOTE — NC FL2 (Signed)
[place] LEVEL OF CARE SCREENING TOOL     IDENTIFICATION  Patient Name: Felicia Anderson Birthdate: 10-15-1940 Sex: female Admission Date (Current Location): 04/23/2021  [facility] and [place] Number:  Engineering geologist and Address:  [facility], [address], [place], [place] [address]      Provider Number: [id number]  Attending Physician Name and Address:  Wyvonnia Dusky, MD  Relative Name and Phone Number:  Gunnar Fusi (daughter) [phone]    Current Level of Care: Hospital Recommended Level of Care: [place] Prior Approval Number:    Date Approved/Denied:   PASRR Number: Pending  Discharge Plan: SNF    Current Diagnoses: Patient Active Problem List   Diagnosis Date Noted   Acute metabolic encephalopathy 72/53/6644   UTI (urinary tract infection) 04/24/2021   Acute encephalopathy 04/24/2021   Decreased calculated glomerular filtration rate (GFR) 03/16/2021   Emphysema lung ([place]) 02/12/2021   Thoracic aortic atherosclerosis ([place]) 02/12/2021   Generalized weakness 02/11/2021   Chest pain 02/11/2021   Primary hyperparathyroidism ([place]) 08/02/2020   Weight gain 07/09/2020   Hypocalcemia 07/07/2020   Educated about COVID-19 virus infection 11/08/2018   Breast cancer screening 04/22/2017   Screening for colon cancer 04/22/2017   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[place]) 01/12/2017   Hypertension 08/25/2016   Chronic constipation 08/25/2016   Generalized anxiety disorder 08/25/2016    Orientation RESPIRATION BLADDER Height & Weight     Self  Normal Continent Weight: 68.5 kg Height:  5\' 1"  (154.9 cm)  BEHAVIORAL SYMPTOMS/MOOD NEUROLOGICAL BOWEL NUTRITION STATUS      Continent Diet (see discharge summary)  AMBULATORY STATUS COMMUNICATION OF NEEDS Skin   Limited Assist Verbally Normal                       Personal Care Assistance Level of Assistance  Bathing, Feeding, Dressing  Bathing Assistance: Limited assistance Feeding assistance: Independent Dressing Assistance: Limited assistance     Functional Limitations Info  Sight, Hearing, Speech Sight Info: Impaired Hearing Info: Impaired Speech Info: Adequate    SPECIAL CARE FACTORS FREQUENCY  PT (By licensed PT), OT (By licensed OT)     PT Frequency: 5 times per week OT Frequency: 5 times per week            Contractures Contractures Info: Not present    Additional Factors Info  Code Status, Allergies Code Status Info: Full Allergies Info: Morphine and related           Current Medications (04/25/2021):  This is the current hospital active medication list Current Facility-Administered Medications  Medication Dose Route Frequency Provider Last Rate Last Admin   acetaminophen (TYLENOL) tablet 650 mg  650 mg Oral Q6H PRN Kathie Dike, MD       Or   acetaminophen (TYLENOL) suppository 650 mg  650 mg Rectal Q6H PRN Kathie Dike, MD       amLODipine (NORVASC) tablet 5 mg  5 mg Oral QHS Kathie Dike, MD   5 mg at 04/24/21 2201   cefTRIAXone (ROCEPHIN) 1 g in sodium chloride 0.9 % 100 mL IVPB  1 g Intravenous Q24H Memon, Jolaine Artist, MD 200 mL/hr at 04/25/21 0000 1 g at 04/25/21 0000   divalproex (DEPAKOTE ER) 24 hr tablet 500 mg  500 mg Oral QHS Kathie Dike, MD   500 mg at 04/24/21 2201   enoxaparin (LOVENOX) injection 40 mg  40 mg Subcutaneous Q24H Kathie Dike, MD   40  mg at 04/24/21 1726   escitalopram (LEXAPRO) tablet 5 mg  5 mg Oral QHS Kathie Dike, MD   5 mg at 04/24/21 2202   fluvoxaMINE (LUVOX) tablet 100 mg  100 mg Oral QHS Kathie Dike, MD   100 mg at 04/24/21 2202   haloperidol lactate (HALDOL) injection 0.5 mg  0.5 mg Intravenous Q2H PRN Athena Masse, MD   0.5 mg at 04/25/21 6213   lactated ringers infusion   Intravenous Continuous Kathie Dike, MD 75 mL/hr at 04/25/21 0221 New Bag at 04/25/21 0221   melatonin tablet 5 mg  5 mg Oral QHS PRN Kathie Dike, MD   5  mg at 04/24/21 2202   ondansetron (ZOFRAN) tablet 4 mg  4 mg Oral Q6H PRN Kathie Dike, MD       Or   ondansetron (ZOFRAN) injection 4 mg  4 mg Intravenous Q6H PRN Kathie Dike, MD       QUEtiapine (SEROQUEL) tablet 25 mg  25 mg Oral BID Kathie Dike, MD   25 mg at 04/25/21 1015     Discharge Medications: Please see discharge summary for a list of discharge medications.  Relevant Imaging Results:  Relevant Lab Results:   Additional Information SS# [id number]  Shelbie Hutching, RN

## 2021-04-25 NOTE — Progress Notes (Signed)
PROGRESS NOTE    Felicia Anderson  DJT:[id number] DOB: 08-22-1940 DOA: 04/23/2021 PCP: Crecencio Mc, MD   Assessment & Plan:   Principal Problem:   Acute metabolic encephalopathy Active Problems:   Hypertension   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[place])   UTI (urinary tract infection)   Acute encephalopathy   Acute metabolic encephalopathy: likely secondary to progressive dementia. Continue w/ supportive care    Unlikely UTI: urine cx shows containment. Continue on IV rocephin x 3 days    Alzheimer's dementia: w/ behavioral disturbance. Continue on home dose depakote, lexapro and seroquel   HTN: continue on amlodipine  Hypokalemia: KCl repleated. Will continue to monitor     DVT prophylaxis: lovenox  Code Status:  full  Family Communication:  Disposition Plan: likely d/c to SNF   Level of care: Med-Surg  Status is: Inpatient  Remains inpatient appropriate because:Inpatient level of care appropriate due to severity of illness  Dispo: The patient is from: Home              Anticipated d/c is to: SNF              Patient currently is not medically stable to d/c.   Difficult to place patient : unclear     Consultants:   Procedures:   Antimicrobials:   Subjective: Pt c/o fatigue   Objective: Vitals:   04/24/21 1726 04/24/21 2039 04/24/21 2305 04/25/21 0535  BP: (!) 165/73 (!) 154/67 (!) 144/68 (!) 165/75  Pulse:  63 64 64  Resp:  17 17 17   Temp:  98.8 F (37.1 C) 99 F (37.2 C) 97.7 F (36.5 C)  TempSrc:  Oral Oral   SpO2:  98% 97% 96%  Weight:      Height:        Intake/Output Summary (Last 24 hours) at 04/25/2021 0832 Last data filed at 04/25/2021 0300 Gross per 24 hour  Intake 731.15 ml  Output --  Net 731.15 ml   Filed Weights   04/24/21 0023  Weight: 68.5 kg    Examination:  General exam: Appears calm and comfortable  Respiratory system: Clear to auscultation. Respiratory effort normal. Cardiovascular system: S1 & S2 +.  No  rubs, gallops or clicks.  Gastrointestinal system: Abdomen is nondistended, soft and nontender. Normal bowel sounds heard. Central nervous system: Alert and oriented to person only. Moves all extremities  Psychiatry: Judgement and insight appear poor. Flat mood and affect    Data Reviewed: I have personally reviewed following labs and imaging studies  CBC: Recent Labs  Lab 04/23/21 1830 04/24/21 1050 04/25/21 0531  WBC 7.7 7.7 6.2  NEUTROABS 5.6  --   --   HGB 13.4 13.5 13.4  HCT 38.1 38.9 40.4  MCV 91.4 90.9 93.1  PLT 237 234 300   Basic Metabolic Panel: Recent Labs  Lab 04/23/21 1830 04/24/21 1050 04/25/21 0531  NA 138  --  140  K 4.1  --  3.4*  CL 102  --  104  CO2 26  --  27  GLUCOSE 99  --  91  BUN 20  --  12  CREATININE 0.95 0.76 0.88  CALCIUM 8.5*  --  8.2*   GFR: Estimated Creatinine Clearance: 46.7 mL/min (by C-G formula based on SCr of 0.88 mg/dL). Liver Function Tests: Recent Labs  Lab 04/23/21 1830  AST 35  ALT 27  ALKPHOS 52  BILITOT 1.0  PROT 7.0  ALBUMIN 3.7   No results for input(s): LIPASE,  AMYLASE in the last 168 hours. No results for input(s): AMMONIA in the last 168 hours. Coagulation Profile: No results for input(s): INR, PROTIME in the last 168 hours. Cardiac Enzymes: No results for input(s): CKTOTAL, CKMB, CKMBINDEX, TROPONINI in the last 168 hours. BNP (last 3 results) No results for input(s): PROBNP in the last 8760 hours. HbA1C: No results for input(s): HGBA1C in the last 72 hours. CBG: No results for input(s): GLUCAP in the last 168 hours. Lipid Profile: No results for input(s): CHOL, HDL, LDLCALC, TRIG, CHOLHDL, LDLDIRECT in the last 72 hours. Thyroid Function Tests: No results for input(s): TSH, T4TOTAL, FREET4, T3FREE, THYROIDAB in the last 72 hours. Anemia Panel: No results for input(s): VITAMINB12, FOLATE, FERRITIN, TIBC, IRON, RETICCTPCT in the last 72 hours. Sepsis Labs: No results for input(s): PROCALCITON,  LATICACIDVEN in the last 168 hours.  Recent Results (from the past 240 hour(s))  Resp Panel by RT-PCR (Flu A&B, Covid) Nasopharyngeal Swab     Status: None   Collection Time: 04/23/21 11:57 PM   Specimen: Nasopharyngeal Swab; Nasopharyngeal(NP) swabs in vial transport medium  Result Value Ref Range Status   SARS Coronavirus 2 by RT PCR NEGATIVE NEGATIVE Final    Comment: (NOTE) SARS-CoV-2 target nucleic acids are NOT DETECTED.  The SARS-CoV-2 RNA is generally detectable in upper respiratory specimens during the acute phase of infection. The lowest concentration of SARS-CoV-2 viral copies this assay can detect is 138 copies/mL. A negative result does not preclude SARS-Cov-2 infection and should not be used as the sole basis for treatment or other patient management decisions. A negative result may occur with  improper specimen collection/handling, submission of specimen other than nasopharyngeal swab, presence of viral mutation(s) within the areas targeted by this assay, and inadequate number of viral copies(<138 copies/mL). A negative result must be combined with clinical observations, patient history, and epidemiological information. The expected result is Negative.  Fact Sheet for Patients:  EntrepreneurPulse.com.au  Fact Sheet for Healthcare Providers:  IncredibleEmployment.be  This test is no t yet approved or cleared by the [place] FDA and  has been authorized for detection and/or diagnosis of SARS-CoV-2 by FDA under an Emergency Use Authorization (EUA). This EUA will remain  in effect (meaning this test can be used) for the duration of the COVID-19 declaration under Section 564(b)(1) of the Act, 21 U.S.C.section 360bbb-3(b)(1), unless the authorization is terminated  or revoked sooner.       Influenza A by PCR NEGATIVE NEGATIVE Final   Influenza B by PCR NEGATIVE NEGATIVE Final    Comment: (NOTE) The Xpert Xpress  SARS-CoV-2/FLU/RSV plus assay is intended as an aid in the diagnosis of influenza from Nasopharyngeal swab specimens and should not be used as a sole basis for treatment. Nasal washings and aspirates are unacceptable for Xpert Xpress SARS-CoV-2/FLU/RSV testing.  Fact Sheet for Patients: EntrepreneurPulse.com.au  Fact Sheet for Healthcare Providers: IncredibleEmployment.be  This test is not yet approved or cleared by the [place] FDA and has been authorized for detection and/or diagnosis of SARS-CoV-2 by FDA under an Emergency Use Authorization (EUA). This EUA will remain in effect (meaning this test can be used) for the duration of the COVID-19 declaration under Section 564(b)(1) of the Act, 21 U.S.C. section 360bbb-3(b)(1), unless the authorization is terminated or revoked.  Performed at [facility], [address] [place], [place] [address]   Urine Culture     Status: Abnormal   Collection Time: 04/23/21 11:57 PM   Specimen: Urine, Random  Result  Value Ref Range Status   Specimen Description   Final    URINE, RANDOM Performed at [facility], [address], [place], [place] [address]    Special Requests   Final    NONE Performed at [facility], [address] [place], [place] [address]    Culture MULTIPLE SPECIES PRESENT, SUGGEST RECOLLECTION (A)  Final   Report Status 04/24/2021 FINAL  Final         Radiology Studies: CT HEAD WO CONTRAST  Result Date: 04/23/2021 CLINICAL DATA:  Altered mental status. EXAM: CT HEAD WITHOUT CONTRAST TECHNIQUE: Contiguous axial images were obtained from the base of the skull through the vertex without intravenous contrast. COMPARISON:  None. FINDINGS: Brain: There is mild cerebral atrophy with widening of the extra-axial spaces and ventricular dilatation. There are areas of decreased attenuation within the white matter tracts of the supratentorial brain,  consistent with microvascular disease changes. 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 Vascular: No hyperdense vessel or unexpected calcification. Skull: Normal. Negative for fracture or focal lesion. Sinuses/Orbits: No acute finding. Other: None. IMPRESSION: 1. Generalized cerebral atrophy. 2. No acute intracranial abnormality. Electronically Signed   By: Virgina Norfolk M.D.   On: 04/23/2021 19:34   MR BRAIN WO CONTRAST  Result Date: 04/25/2021 CLINICAL DATA:  Initial evaluation for mental status change, unknown cause. EXAM: MRI HEAD WITHOUT CONTRAST TECHNIQUE: Multiplanar, multiecho pulse sequences of the brain and surrounding structures were obtained without intravenous contrast. COMPARISON:  CT from 04/23/2021. FINDINGS: Brain: Examination is technically limited as the patient was unable to tolerate the full length of the exam. Diffusion-weighted sequences, axial T2 weighted sequence, and sagittal T1 weighted sequence only were performed. Generalized age-related cerebral atrophy. Diffusion-weighted imaging demonstrates no evidence for acute or subacute infarct. Gray-white matter differentiation maintained. No visible areas of chronic cortical infarction. No appreciable mass lesion, mass effect or midline shift. Ventricular prominence related to global parenchymal volume loss without hydrocephalus. No visible extra-axial fluid collection. Vascular: Major intracranial vascular flow voids are grossly maintained at the skull base on this limited exam. Skull and upper cervical spine: Craniocervical junction within normal limits. Bone marrow signal intensity normal. No scalp soft tissue abnormality. Sinuses/Orbits: Globes and orbital soft tissues demonstrate no obvious abnormality. Paranasal sinuses are grossly clear. No visible mastoid effusion. Other: None. IMPRESSION: 1. Technically limited exam due to the patient's inability to tolerate the full length of the study. 2. No acute  intracranial infarct. No other definite acute intracranial abnormality. Electronically Signed   By: Jeannine Boga M.D.   On: 04/25/2021 04:13        Scheduled Meds:  amLODipine  5 mg Oral QHS   divalproex  500 mg Oral QHS   enoxaparin (LOVENOX) injection  40 mg Subcutaneous Q24H   escitalopram  5 mg Oral QHS   fluvoxaMINE  100 mg Oral QHS   QUEtiapine  25 mg Oral BID   Continuous Infusions:  cefTRIAXone (ROCEPHIN)  IV 1 g (04/25/21 0000)   lactated ringers 75 mL/hr at 04/25/21 0221     LOS: 1 day    Time spent: 32 mins     Wyvonnia Dusky, MD [facility] Pager [phone]  04/25/2021, 8:32 AM

## 2021-04-26 LAB — CBC
HCT: 39.1 % (ref 36.0–46.0)
Hemoglobin: 13.1 g/dL (ref 12.0–15.0)
MCH: 30.8 pg (ref 26.0–34.0)
MCHC: 33.5 g/dL (ref 30.0–36.0)
MCV: 91.8 fL (ref 80.0–100.0)
Platelets: 220 10*3/uL (ref 150–400)
RBC: 4.26 MIL/uL (ref 3.87–5.11)
RDW: 13.9 % (ref 11.5–15.5)
WBC: 7.6 10*3/uL (ref 4.0–10.5)
nRBC: 0 % (ref 0.0–0.2)

## 2021-04-26 LAB — BASIC METABOLIC PANEL
Anion gap: 7 (ref 5–15)
BUN: 11 mg/dL (ref 8–23)
CO2: 29 mmol/L (ref 22–32)
Calcium: 8.2 mg/dL — ABNORMAL LOW (ref 8.9–10.3)
Chloride: 105 mmol/L (ref 98–111)
Creatinine, Ser: 0.86 mg/dL (ref 0.44–1.00)
GFR, Estimated: >60 mL/min (ref >60)
Glucose, Bld: 100 mg/dL — ABNORMAL HIGH (ref 70–99)
Potassium: 3.8 mmol/L (ref 3.5–5.1)
Sodium: 141 mmol/L (ref 135–145)

## 2021-04-26 MED ORDER — HALOPERIDOL LACTATE 5 MG/ML IJ SOLN
1.0000 mg | Freq: Four times a day (QID) | INTRAMUSCULAR | Status: DC | PRN
  Administered 2021-04-26: 1 mg via INTRAMUSCULAR
  Filled 2021-04-26: qty 1

## 2021-04-26 MED ORDER — HALOPERIDOL LACTATE 5 MG/ML IJ SOLN: 0.5000 mg | INTRAMUSCULAR | Status: DC | PRN

## 2021-04-26 NOTE — Plan of Care (Signed)
  Problem: Clinical Measurements: Goal: Will remain free from infection Outcome: Progressing   Problem: Activity: Goal: Risk for activity intolerance will decrease Outcome: Progressing   Problem: Safety: Goal: Ability to remain free from injury will improve Outcome: Progressing   

## 2021-04-26 NOTE — Progress Notes (Signed)
PROGRESS NOTE    Felicia Anderson  XVE:[id number] DOB: 10-14-1940 DOA: 04/23/2021 PCP: Crecencio Mc, MD   Assessment & Plan:   Principal Problem:   Acute metabolic encephalopathy Active Problems:   Hypertension   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[place])   UTI (urinary tract infection)   Acute encephalopathy   Acute metabolic encephalopathy: likely secondary to progressive dementia. Sitter was d/c 04/26/21. Continue w/ supportive care   Unlikely UTI: urine cx shows containment. Completed abx course    Alzheimer's dementia: w/ behavioral disturbance. Continue on home dose of depakote, lexapro, & seroquel     HTN: continue on CCB   Hypokalemia: WNL today      DVT prophylaxis: lovenox  Code Status:  full  Family Communication: discussed pt's care w/ pt's daughter, [name], and answered her questions  Disposition Plan: likely d/c to SNF   Level of care: Med-Surg  Status is: Inpatient  Remains inpatient appropriate because:Inpatient level of care appropriate due to severity of illness  Dispo: The patient is from: Home              Anticipated d/c is to: SNF              Patient currently is not medically stable to d/c.   Difficult to place patient : unclear     Consultants:   Procedures:   Antimicrobials:   Subjective: Pt denies any complaints    Objective: Vitals:   04/25/21 1518 04/25/21 2013 04/26/21 0008 04/26/21 0423  BP: (!) 146/68 (!) 150/67 140/64 (!) 158/75  Pulse: 60 63 65   Resp: 17 16 18 18   Temp: 98.5 F (36.9 C) 98.5 F (36.9 C) 98 F (36.7 C) 98.3 F (36.8 C)  TempSrc: Oral Oral  Axillary  SpO2: 98% 96% 95% 95%  Weight:      Height:        Intake/Output Summary (Last 24 hours) at 04/26/2021 0746 Last data filed at 04/26/2021 0600 Gross per 24 hour  Intake 836.93 ml  Output --  Net 836.93 ml   Filed Weights   04/24/21 0023  Weight: 68.5 kg    Examination:  General exam: Appears confused but comfortable  Respiratory  system: Clear breath sounds b/l  Cardiovascular system: S1/S2+. No rubs or click  Gastrointestinal system: Abd is soft, NT, ND & normal bowel sounds  Central nervous system: Alert & oriented to self only  Psychiatry: Judgement and insight appears poor. Flat mood and affect    Data Reviewed: I have personally reviewed following labs and imaging studies  CBC: Recent Labs  Lab 04/23/21 1830 04/24/21 1050 04/25/21 0531 04/26/21 0410  WBC 7.7 7.7 6.2 7.6  NEUTROABS 5.6  --   --   --   HGB 13.4 13.5 13.4 13.1  HCT 38.1 38.9 40.4 39.1  MCV 91.4 90.9 93.1 91.8  PLT 237 234 206 574   Basic Metabolic Panel: Recent Labs  Lab 04/23/21 1830 04/24/21 1050 04/25/21 0531 04/26/21 0410  NA 138  --  140 141  K 4.1  --  3.4* 3.8  CL 102  --  104 105  CO2 26  --  27 29  GLUCOSE 99  --  91 100*  BUN 20  --  12 11  CREATININE 0.95 0.76 0.88 0.86  CALCIUM 8.5*  --  8.2* 8.2*   GFR: Estimated Creatinine Clearance: 47.7 mL/min (by C-G formula based on SCr of 0.86 mg/dL). Liver Function Tests: Recent Labs  Lab 04/23/21  1830  AST 35  ALT 27  ALKPHOS 52  BILITOT 1.0  PROT 7.0  ALBUMIN 3.7   No results for input(s): LIPASE, AMYLASE in the last 168 hours. No results for input(s): AMMONIA in the last 168 hours. Coagulation Profile: No results for input(s): INR, PROTIME in the last 168 hours. Cardiac Enzymes: No results for input(s): CKTOTAL, CKMB, CKMBINDEX, TROPONINI in the last 168 hours. BNP (last 3 results) No results for input(s): PROBNP in the last 8760 hours. HbA1C: No results for input(s): HGBA1C in the last 72 hours. CBG: No results for input(s): GLUCAP in the last 168 hours. Lipid Profile: No results for input(s): CHOL, HDL, LDLCALC, TRIG, CHOLHDL, LDLDIRECT in the last 72 hours. Thyroid Function Tests: No results for input(s): TSH, T4TOTAL, FREET4, T3FREE, THYROIDAB in the last 72 hours. Anemia Panel: No results for input(s): VITAMINB12, FOLATE, FERRITIN, TIBC, IRON,  RETICCTPCT in the last 72 hours. Sepsis Labs: No results for input(s): PROCALCITON, LATICACIDVEN in the last 168 hours.  Recent Results (from the past 240 hour(s))  Resp Panel by RT-PCR (Flu A&B, Covid) Nasopharyngeal Swab     Status: None   Collection Time: 04/23/21 11:57 PM   Specimen: Nasopharyngeal Swab; Nasopharyngeal(NP) swabs in vial transport medium  Result Value Ref Range Status   SARS Coronavirus 2 by RT PCR NEGATIVE NEGATIVE Final    Comment: (NOTE) SARS-CoV-2 target nucleic acids are NOT DETECTED.  The SARS-CoV-2 RNA is generally detectable in upper respiratory specimens during the acute phase of infection. The lowest concentration of SARS-CoV-2 viral copies this assay can detect is 138 copies/mL. A negative result does not preclude SARS-Cov-2 infection and should not be used as the sole basis for treatment or other patient management decisions. A negative result may occur with  improper specimen collection/handling, submission of specimen other than nasopharyngeal swab, presence of viral mutation(s) within the areas targeted by this assay, and inadequate number of viral copies(<138 copies/mL). A negative result must be combined with clinical observations, patient history, and epidemiological information. The expected result is Negative.  Fact Sheet for Patients:  EntrepreneurPulse.com.au  Fact Sheet for Healthcare Providers:  IncredibleEmployment.be  This test is no t yet approved or cleared by the [place] FDA and  has been authorized for detection and/or diagnosis of SARS-CoV-2 by FDA under an Emergency Use Authorization (EUA). This EUA will remain  in effect (meaning this test can be used) for the duration of the COVID-19 declaration under Section 564(b)(1) of the Act, 21 U.S.C.section 360bbb-3(b)(1), unless the authorization is terminated  or revoked sooner.       Influenza A by PCR NEGATIVE NEGATIVE Final   Influenza  B by PCR NEGATIVE NEGATIVE Final    Comment: (NOTE) The Xpert Xpress SARS-CoV-2/FLU/RSV plus assay is intended as an aid in the diagnosis of influenza from Nasopharyngeal swab specimens and should not be used as a sole basis for treatment. Nasal washings and aspirates are unacceptable for Xpert Xpress SARS-CoV-2/FLU/RSV testing.  Fact Sheet for Patients: EntrepreneurPulse.com.au  Fact Sheet for Healthcare Providers: IncredibleEmployment.be  This test is not yet approved or cleared by the [place] FDA and has been authorized for detection and/or diagnosis of SARS-CoV-2 by FDA under an Emergency Use Authorization (EUA). This EUA will remain in effect (meaning this test can be used) for the duration of the COVID-19 declaration under Section 564(b)(1) of the Act, 21 U.S.C. section 360bbb-3(b)(1), unless the authorization is terminated or revoked.  Performed at [facility], [address] [place],  [place] [address]   Urine Culture     Status: Abnormal   Collection Time: 04/23/21 11:57 PM   Specimen: Urine, Random  Result Value Ref Range Status   Specimen Description   Final    URINE, RANDOM Performed at [facility], [address], [place], [place] [address]    Special Requests   Final    NONE Performed at [facility], [address] [place], [place] [address]    Culture MULTIPLE SPECIES PRESENT, SUGGEST RECOLLECTION (A)  Final   Report Status 04/24/2021 FINAL  Final         Radiology Studies: MR BRAIN WO CONTRAST  Result Date: 04/25/2021 CLINICAL DATA:  Initial evaluation for mental status change, unknown cause. EXAM: MRI HEAD WITHOUT CONTRAST TECHNIQUE: Multiplanar, multiecho pulse sequences of the brain and surrounding structures were obtained without intravenous contrast. COMPARISON:  CT from 04/23/2021. FINDINGS: Brain: Examination is technically limited as the patient was unable to tolerate the  full length of the exam. Diffusion-weighted sequences, axial T2 weighted sequence, and sagittal T1 weighted sequence only were performed. Generalized age-related cerebral atrophy. Diffusion-weighted imaging demonstrates no evidence for acute or subacute infarct. Gray-white matter differentiation maintained. No visible areas of chronic cortical infarction. No appreciable mass lesion, mass effect or midline shift. Ventricular prominence related to global parenchymal volume loss without hydrocephalus. No visible extra-axial fluid collection. Vascular: Major intracranial vascular flow voids are grossly maintained at the skull base on this limited exam. Skull and upper cervical spine: Craniocervical junction within normal limits. Bone marrow signal intensity normal. No scalp soft tissue abnormality. Sinuses/Orbits: Globes and orbital soft tissues demonstrate no obvious abnormality. Paranasal sinuses are grossly clear. No visible mastoid effusion. Other: None. IMPRESSION: 1. Technically limited exam due to the patient's inability to tolerate the full length of the study. 2. No acute intracranial infarct. No other definite acute intracranial abnormality. Electronically Signed   By: Jeannine Boga M.D.   On: 04/25/2021 04:13        Scheduled Meds:  amLODipine  5 mg Oral QHS   divalproex  500 mg Oral QHS   docusate sodium  200 mg Oral BID   enoxaparin (LOVENOX) injection  40 mg Subcutaneous Q24H   escitalopram  5 mg Oral QHS   fluvoxaMINE  100 mg Oral QHS   polyethylene glycol  17 g Oral Daily   QUEtiapine  25 mg Oral BID   Continuous Infusions:  cefTRIAXone (ROCEPHIN)  IV Stopped (04/25/21 2143)   lactated ringers 75 mL/hr at 04/26/21 0418     LOS: 2 days    Time spent: 25 mins     Wyvonnia Dusky, MD [facility] Pager [phone]  04/26/2021, 7:46 AM

## 2021-04-26 NOTE — TOC Progression Note (Signed)
Transition of Care [facility]) - Progression Note    Patient Details  Name: Felicia Anderson MRN: [id number] Date of Birth: 09-24-1940  Transition of [facility] CM/SW Contact  Shelbie Hutching, RN Phone Number: 04/26/2021, 4:25 PM  Clinical Narrative:    Patient's family chooses [facility].  Ebony Hail notified that patient chooses their facility and should be ready tomorrow for discharge.   Expected Discharge Plan: [place] Barriers to Discharge: Continued Medical Work up  Expected Discharge Plan and Services Expected Discharge Plan: [place]   Discharge Planning Services: CM Consult Post Acute Care Choice: [place] Living arrangements for the past 2 months: Single Family Home                 DME Arranged: N/A DME Agency: NA       HH Arranged: NA HH Agency: NA         Social Determinants of Health (SDOH) Interventions    Readmission Risk Interventions No flowsheet data found.

## 2021-04-26 NOTE — Progress Notes (Signed)
Patient tampering and removing cardiac monitor. Attempts to redirect to leave on have been unsuccessful. Per provider it is okay to discontinue telemetry.

## 2021-04-26 NOTE — Progress Notes (Signed)
Sitter order discontinued this am, frequent rounding and assistance provided for this patient to ensure safety. IV access removed by this patient. Per provider, it is okay to leave out.

## 2021-04-26 NOTE — Progress Notes (Signed)
Occupational Therapy Treatment Patient Details Name: Felicia Anderson MRN: [id number] DOB: 24-Aug-1940 Today's Date: 04/26/2021   History of present illness 80 y.o. female with medical history significant for HTN and dementia who lives with daughter and son-in-law and who recently completed a course of Keflex for UTI who was brought in because of persistent confusion, fall the night prior raising, and concerns R-sided weakness.  Family reports her confusion is outside her baseline for dementia.  CT head negative for acute intracranial abnormality.   OT comments  Chart reviewed, RN cleared pt for participation in OT tx session. Tx session targeted progressing functional mobility, increased independence in ADL, improving orientation. Pt is oriented only to self. When re-oriented pt is unable to replicate information. Pt is agreeable to all tasks throughout tx session, however step by step vcs required for ADL task completion. Poor balance noted throughout with posterior and R lateral lean noted in sitting and standing. Pt is progressing towards goals and will continue to benefit from skilled OT to address functional deficits. Pt is left in care of NT, all needs met, NAD.    Recommendations for follow up therapy are one component of a multi-disciplinary discharge planning process, led by the attending physician.  Recommendations may be updated based on patient status, additional functional criteria and insurance authorization.    Follow Up Recommendations  SNF    Equipment Recommendations  3 in 1 bedside commode;Tub/shower seat    Recommendations for Other Services      Precautions / Restrictions Precautions Precautions: Fall Restrictions Weight Bearing Restrictions: No       Mobility Bed Mobility Overal bed mobility: Needs Assistance Bed Mobility: Sit to Supine;Supine to Sit     Supine to sit: Mod assist Sit to supine: Mod assist   General bed mobility comments: At edge of bed pt  required MIN A for upright seated position while completing dynamic ADL tasks in sitting    Transfers Overall transfer level: Needs assistance Equipment used: Rolling walker (2 wheeled) Transfers: Sit to/from Stand Sit to Stand: Min assist         General transfer comment: Attempted 4x, able to sustain standing for approx 45 seconds-1 minute, cueing required throghout for attention to task    Balance Overall balance assessment: Needs assistance Sitting-balance support: Bilateral upper extremity supported;Feet unsupported Sitting balance-Leahy Scale: Poor Sitting balance - Comments: right lateral and posterior lean requiring MOD A for assist   Standing balance support: Bilateral upper extremity supported;During functional activity Standing balance-Leahy Scale: Poor Standing balance comment: with RW                           ADL either performed or assessed with clinical judgement   ADL Overall ADL's : Needs assistance/impaired                                       General ADL Comments: MIN A for grooming tasks seated at edge of bed (washing face/hands), MIN A for UB dressing seated at edge of bed all with step by step cueing      Cognition Arousal/Alertness: Awake/alert Behavior During Therapy: Flat affect;Impulsive   Area of Impairment: Orientation;Attention;Memory;Following commands                 Orientation Level: Disoriented to;Place;Time;Situation Current Attention Level: Focused Memory: Decreased short-term memory;Decreased recall of precautions Following Commands:  Follows one step commands inconsistently       General Comments: Pt pleasently confused, oriented to self. Pt requires step by step cueing for ADL completion                   Pertinent Vitals/ Pain       Pain Assessment: No/denies pain         Frequency  Min 1X/week        Progress Toward Goals  OT Goals(current goals can now be found in the  care plan section)  Progress towards OT goals: Progressing toward goals     Plan Discharge plan remains appropriate       AM-PAC OT "6 Clicks" Daily Activity     Outcome Measure   Help from another person eating meals?: None Help from another person taking care of personal grooming?: A Little Help from another person toileting, which includes using toliet, bedpan, or urinal?: A Lot Help from another person bathing (including washing, rinsing, drying)?: A Lot Help from another person to put on and taking off regular upper body clothing?: A Little Help from another person to put on and taking off regular lower body clothing?: A Lot 6 Click Score: 16    End of Session Equipment Utilized During Treatment: Gait belt;Rolling walker  OT Visit Diagnosis: Unsteadiness on feet (R26.81);Muscle weakness (generalized) (M62.81);Other symptoms and signs involving cognitive function;Repeated falls (R29.6)   Activity Tolerance Patient tolerated treatment well   Patient Left in bed;with call bell/phone within reach;with nursing/sitter in room   Nurse Communication Mobility status        Time: 9924-2683 OT Time Calculation (min): 33 min  Charges: OT General Charges $OT Visit: 1 Visit OT Treatments $Self Care/Home Management : 23-37 mins  Shanon Payor, OTD OTR/L  04/26/21, 1:00 PM

## 2021-04-27 DIAGNOSIS — F422 Mixed obsessional thoughts and acts: Secondary | ICD-10-CM | POA: Diagnosis not present

## 2021-04-27 DIAGNOSIS — E210 Primary hyperparathyroidism: Secondary | ICD-10-CM | POA: Diagnosis present

## 2021-04-27 DIAGNOSIS — R531 Weakness: Secondary | ICD-10-CM | POA: Diagnosis not present

## 2021-04-27 DIAGNOSIS — I700 Atherosclerosis of aorta: Secondary | ICD-10-CM | POA: Diagnosis present

## 2021-04-27 DIAGNOSIS — Z7401 Bed confinement status: Secondary | ICD-10-CM | POA: Diagnosis not present

## 2021-04-27 DIAGNOSIS — F339 Major depressive disorder, recurrent, unspecified: Secondary | ICD-10-CM | POA: Diagnosis present

## 2021-04-27 DIAGNOSIS — W050XXA Fall from non-moving wheelchair, initial encounter: Secondary | ICD-10-CM | POA: Diagnosis not present

## 2021-04-27 DIAGNOSIS — G9341 Metabolic encephalopathy: Secondary | ICD-10-CM | POA: Diagnosis present

## 2021-04-27 DIAGNOSIS — R404 Transient alteration of awareness: Secondary | ICD-10-CM | POA: Diagnosis not present

## 2021-04-27 DIAGNOSIS — N390 Urinary tract infection, site not specified: Secondary | ICD-10-CM | POA: Diagnosis present

## 2021-04-27 DIAGNOSIS — F02818 Dementia in other diseases classified elsewhere, unspecified severity, with other behavioral disturbance: Secondary | ICD-10-CM | POA: Diagnosis not present

## 2021-04-27 DIAGNOSIS — E039 Hypothyroidism, unspecified: Secondary | ICD-10-CM | POA: Diagnosis not present

## 2021-04-27 DIAGNOSIS — F0390 Unspecified dementia without behavioral disturbance: Secondary | ICD-10-CM | POA: Diagnosis not present

## 2021-04-27 DIAGNOSIS — H524 Presbyopia: Secondary | ICD-10-CM | POA: Diagnosis not present

## 2021-04-27 DIAGNOSIS — H903 Sensorineural hearing loss, bilateral: Secondary | ICD-10-CM | POA: Diagnosis not present

## 2021-04-27 DIAGNOSIS — I10 Essential (primary) hypertension: Secondary | ICD-10-CM | POA: Diagnosis present

## 2021-04-27 DIAGNOSIS — Z79899 Other long term (current) drug therapy: Secondary | ICD-10-CM | POA: Diagnosis not present

## 2021-04-27 DIAGNOSIS — E119 Type 2 diabetes mellitus without complications: Secondary | ICD-10-CM | POA: Diagnosis not present

## 2021-04-27 DIAGNOSIS — S0093XA Contusion of unspecified part of head, initial encounter: Secondary | ICD-10-CM | POA: Diagnosis not present

## 2021-04-27 DIAGNOSIS — H25813 Combined forms of age-related cataract, bilateral: Secondary | ICD-10-CM | POA: Diagnosis not present

## 2021-04-27 DIAGNOSIS — R41841 Cognitive communication deficit: Secondary | ICD-10-CM | POA: Diagnosis present

## 2021-04-27 DIAGNOSIS — G301 Alzheimer's disease with late onset: Secondary | ICD-10-CM | POA: Diagnosis not present

## 2021-04-27 DIAGNOSIS — S0083XA Contusion of other part of head, initial encounter: Secondary | ICD-10-CM | POA: Diagnosis not present

## 2021-04-27 DIAGNOSIS — M6281 Muscle weakness (generalized): Secondary | ICD-10-CM | POA: Diagnosis present

## 2021-04-27 DIAGNOSIS — F29 Unspecified psychosis not due to a substance or known physiological condition: Secondary | ICD-10-CM | POA: Diagnosis not present

## 2021-04-27 DIAGNOSIS — F411 Generalized anxiety disorder: Secondary | ICD-10-CM | POA: Diagnosis not present

## 2021-04-27 DIAGNOSIS — G4709 Other insomnia: Secondary | ICD-10-CM | POA: Diagnosis not present

## 2021-04-27 DIAGNOSIS — Z741 Need for assistance with personal care: Secondary | ICD-10-CM | POA: Diagnosis present

## 2021-04-27 DIAGNOSIS — F332 Major depressive disorder, recurrent severe without psychotic features: Secondary | ICD-10-CM | POA: Diagnosis not present

## 2021-04-27 DIAGNOSIS — R262 Difficulty in walking, not elsewhere classified: Secondary | ICD-10-CM | POA: Diagnosis present

## 2021-04-27 DIAGNOSIS — R1310 Dysphagia, unspecified: Secondary | ICD-10-CM | POA: Diagnosis present

## 2021-04-27 DIAGNOSIS — R0902 Hypoxemia: Secondary | ICD-10-CM | POA: Diagnosis not present

## 2021-04-27 DIAGNOSIS — G309 Alzheimer's disease, unspecified: Secondary | ICD-10-CM | POA: Diagnosis present

## 2021-04-27 DIAGNOSIS — R296 Repeated falls: Secondary | ICD-10-CM | POA: Diagnosis not present

## 2021-04-27 DIAGNOSIS — W1830XA Fall on same level, unspecified, initial encounter: Secondary | ICD-10-CM | POA: Diagnosis not present

## 2021-04-27 DIAGNOSIS — G4700 Insomnia, unspecified: Secondary | ICD-10-CM | POA: Diagnosis present

## 2021-04-27 DIAGNOSIS — J302 Other seasonal allergic rhinitis: Secondary | ICD-10-CM | POA: Diagnosis present

## 2021-04-27 DIAGNOSIS — Z87440 Personal history of urinary (tract) infections: Secondary | ICD-10-CM | POA: Diagnosis not present

## 2021-04-27 DIAGNOSIS — D649 Anemia, unspecified: Secondary | ICD-10-CM | POA: Diagnosis not present

## 2021-04-27 DIAGNOSIS — J439 Emphysema, unspecified: Secondary | ICD-10-CM | POA: Diagnosis present

## 2021-04-27 DIAGNOSIS — I952 Hypotension due to drugs: Secondary | ICD-10-CM | POA: Diagnosis not present

## 2021-04-27 DIAGNOSIS — F0280 Dementia in other diseases classified elsewhere without behavioral disturbance: Secondary | ICD-10-CM | POA: Diagnosis not present

## 2021-04-27 DIAGNOSIS — Z9181 History of falling: Secondary | ICD-10-CM | POA: Diagnosis not present

## 2021-04-27 DIAGNOSIS — Z23 Encounter for immunization: Secondary | ICD-10-CM | POA: Diagnosis not present

## 2021-04-27 DIAGNOSIS — W19XXXA Unspecified fall, initial encounter: Secondary | ICD-10-CM | POA: Diagnosis not present

## 2021-04-27 DIAGNOSIS — F02C11 Dementia in other diseases classified elsewhere, severe, with agitation: Secondary | ICD-10-CM | POA: Diagnosis not present

## 2021-04-27 LAB — CBC
HCT: 41.5 % (ref 36.0–46.0)
Hemoglobin: 13.8 g/dL (ref 12.0–15.0)
MCH: 30.3 pg (ref 26.0–34.0)
MCHC: 33.3 g/dL (ref 30.0–36.0)
MCV: 91.2 fL (ref 80.0–100.0)
Platelets: 235 10*3/uL (ref 150–400)
RBC: 4.55 MIL/uL (ref 3.87–5.11)
RDW: 13.7 % (ref 11.5–15.5)
WBC: 7.4 10*3/uL (ref 4.0–10.5)
nRBC: 0 % (ref 0.0–0.2)

## 2021-04-27 LAB — BASIC METABOLIC PANEL
Anion gap: 9 (ref 5–15)
BUN: 14 mg/dL (ref 8–23)
CO2: 26 mmol/L (ref 22–32)
Calcium: 8.5 mg/dL — ABNORMAL LOW (ref 8.9–10.3)
Chloride: 103 mmol/L (ref 98–111)
Creatinine, Ser: 0.83 mg/dL (ref 0.44–1.00)
GFR, Estimated: >60 mL/min (ref >60)
Glucose, Bld: 105 mg/dL — ABNORMAL HIGH (ref 70–99)
Potassium: 3.5 mmol/L (ref 3.5–5.1)
Sodium: 138 mmol/L (ref 135–145)

## 2021-04-27 LAB — RESP PANEL BY RT-PCR (FLU A&B, COVID) ARPGX2
Influenza A by PCR: NEGATIVE
Influenza B by PCR: NEGATIVE
SARS Coronavirus 2 by RT PCR: NEGATIVE

## 2021-04-27 MED ORDER — HYDRALAZINE HCL 50 MG PO TABS
50.0000 mg | ORAL_TABLET | Freq: Four times a day (QID) | ORAL | Status: DC | PRN
  Administered 2021-04-27: 09:00:00 50 mg via ORAL
  Filled 2021-04-27: qty 1

## 2021-04-27 MED ORDER — HYDRALAZINE HCL 20 MG/ML IJ SOLN: 20.0000 mg | Freq: Four times a day (QID) | INTRAMUSCULAR | Status: DC | PRN

## 2021-04-27 MED ORDER — HYDRALAZINE HCL 50 MG PO TABS: 50.0000 mg | ORAL_TABLET | Freq: Four times a day (QID) | ORAL | Status: DC | PRN

## 2021-04-27 NOTE — Discharge Summary (Signed)
Physician Discharge Summary  Felicia Anderson RJJ:[id number] DOB: May 07, 1941 DOA: 04/23/2021  PCP: Crecencio Mc, MD  Admit date: 04/23/2021 Discharge date: 04/27/2021  Admitted From: home  Disposition:  SNF  Recommendations for Outpatient Follow-up:  Follow up with PCP in 1-2 weeks   Home Health: no  Equipment/Devices:  Discharge Condition: stable  CODE STATUS: full  Diet recommendation: Heart Healthy  Brief/Interim Summary: HPI was taken from Dr. Damita Dunnings: Felicia Anderson is a 80 y.o. female with medical history significant for HTN and dementia who lives with daughter and son-in-law and who recently completed a course of Keflex for UTI who was brought in because of persistent confusion and a fall the night prior raising concerns for continued UTI.  This confusion is outside her baseline for dementia.  She has had no fever or chills, vomiting or diarrhea.  Foul urinary odor from recent UTI has cleared.   ED course: Vitals within normal limits Blood work unremarkable which included CBC, CMP and troponin Urinalysis with small leukocyte esterase and rare bacteria   EKG, personally viewed and interpreted: Sinus at 65 with no acute ST-T wave changes   Imaging: CT head with generalized cerebral atrophy and no acute intracranial abnormality   Patient started on Rocephin.  Hospitalist consulted for admission.   Hospital course from Dr. Jimmye Norman 10/5-10/7/22: Pt presented w/ encephalopathy likely secondary to progressive dementia. Initially pt was thought to have a UTI & urine cx grew multiple species, likely containment. Pt did however receive a short course of abxs. Of note, pt did initially require a sitter but that was d/c and pt was able remain sitter free >24 hours.   Discharge Diagnoses:  Principal Problem:   Acute metabolic encephalopathy Active Problems:   Hypertension   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HCC)   UTI (urinary tract infection)   Acute  encephalopathy Acute metabolic encephalopathy: likely secondary to progressive dementia. Sitter was d/c 04/26/21. Continue w/ supportive care   Unlikely UTI: urine cx shows containment. Completed abx course    Alzheimer's dementia: w/ behavioral disturbance. Continue on home dose of depakote, lexapro, & seroquel     HTN: continue on CCB   Hypokalemia: WNL today      Discharge Instructions  Discharge Instructions     Diet - low sodium heart healthy   Complete by: As directed    Discharge instructions   Complete by: As directed    F/u w/ PCP in 1-2 weeks   Increase activity slowly   Complete by: As directed       Allergies as of 04/27/2021       Reactions   Morphine And Related Other (See Comments)   Hallucinations        Medication List     STOP taking these medications    cephALEXin 500 MG capsule Commonly known as: [place] these medications    amLODipine 5 MG tablet Commonly known as: NORVASC TAKE 1 TABLET (5 MG TOTAL) BY MOUTH DAILY. What changed: when to take this   cyanocobalamin 1000 MCG/ML injection Commonly known as: (VITAMIN B-12) Inject 1 mL (1,000 mcg total) into the muscle once a week. What changed: when to take this   divalproex 250 MG 24 hr tablet Commonly known as: DEPAKOTE ER Take 2 tablets (500 mg total) by mouth at bedtime.   escitalopram 5 MG tablet Commonly known as: LEXAPRO Take 5 mg by mouth at bedtime.   fluvoxaMINE 100 MG tablet Commonly known  as: LUVOX Take 1 tablet (100 mg total) by mouth at bedtime.   loratadine 10 MG tablet Commonly known as: CLARITIN Take 10 mg by mouth daily as needed for allergies.   melatonin 5 MG Tabs Take 5 mg by mouth at bedtime as needed (sleep).   multivitamin tablet Take 1 tablet by mouth daily.   QUEtiapine 25 MG tablet Commonly known as: SEROQUEL Take 1 tablet (25 mg total) by mouth 2 (two) times daily.   SYRINGE 3CC/25GX1" 25G X 1" 3 ML Misc Use for b12 injections         Contact information for after-discharge care     [place] .   Service: Skilled Nursing Contact information: [address] [place] [phone]                    Allergies  Allergen Reactions   Morphine And Related Other (See Comments)    Hallucinations    Consultations:    Procedures/Studies: CT HEAD WO CONTRAST  Result Date: 04/23/2021 CLINICAL DATA:  Altered mental status. EXAM: CT HEAD WITHOUT CONTRAST TECHNIQUE: Contiguous axial images were obtained from the base of the skull through the vertex without intravenous contrast. COMPARISON:  None. FINDINGS: Brain: There is mild cerebral atrophy with widening of the extra-axial spaces and ventricular dilatation. There are areas of decreased attenuation within the white matter tracts of the supratentorial brain, consistent with microvascular disease changes. 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 Vascular: No hyperdense vessel or unexpected calcification. Skull: Normal. Negative for fracture or focal lesion. Sinuses/Orbits: No acute finding. Other: None. IMPRESSION: 1. Generalized cerebral atrophy. 2. No acute intracranial abnormality. Electronically Signed   By: Virgina Norfolk M.D.   On: 04/23/2021 19:34   MR BRAIN WO CONTRAST  Result Date: 04/25/2021 CLINICAL DATA:  Initial evaluation for mental status change, unknown cause. EXAM: MRI HEAD WITHOUT CONTRAST TECHNIQUE: Multiplanar, multiecho pulse sequences of the brain and surrounding structures were obtained without intravenous contrast. COMPARISON:  CT from 04/23/2021. FINDINGS: Brain: Examination is technically limited as the patient was unable to tolerate the full length of the exam. Diffusion-weighted sequences, axial T2 weighted sequence, and sagittal T1 weighted sequence only were performed. Generalized age-related cerebral atrophy. Diffusion-weighted  imaging demonstrates no evidence for acute or subacute infarct. Gray-white matter differentiation maintained. No visible areas of chronic cortical infarction. No appreciable mass lesion, mass effect or midline shift. Ventricular prominence related to global parenchymal volume loss without hydrocephalus. No visible extra-axial fluid collection. Vascular: Major intracranial vascular flow voids are grossly maintained at the skull base on this limited exam. Skull and upper cervical spine: Craniocervical junction within normal limits. Bone marrow signal intensity normal. No scalp soft tissue abnormality. Sinuses/Orbits: Globes and orbital soft tissues demonstrate no obvious abnormality. Paranasal sinuses are grossly clear. No visible mastoid effusion. Other: None. IMPRESSION: 1. Technically limited exam due to the patient's inability to tolerate the full length of the study. 2. No acute intracranial infarct. No other definite acute intracranial abnormality. Electronically Signed   By: Jeannine Boga M.D.   On: 04/25/2021 04:13   (Echo, Carotid, EGD, Colonoscopy, ERCP)    Subjective: Pt c/o fatigue    Discharge Exam: Vitals:   04/27/21 0742 04/27/21 1132  BP: (!) 163/75 117/70  Pulse: 62 69  Resp: 16 16  Temp: 97.7 F (36.5 C) 97.8 F (36.6 C)  SpO2: 97% 97%   Vitals:  04/27/21 0123 04/27/21 0523 04/27/21 0742 04/27/21 1132  BP: (!) 144/76 (!) 159/81 (!) 163/75 117/70  Pulse: 66 65 62 69  Resp: 18 16 16 16   Temp: 98.1 F (36.7 C) 97.6 F (36.4 C) 97.7 F (36.5 C) 97.8 F (36.6 C)  TempSrc: Oral  Oral Oral  SpO2: 95% 97% 97% 97%  Weight:      Height:        General: Pt is alert, awake, not in acute distress Cardiovascular: S1/S2 +, no rubs, no gallops Respiratory: CTA bilaterally, no wheezing, no rhonchi Abdominal: Soft, NT, ND, bowel sounds + Extremities: no edema, no cyanosis    The results of significant diagnostics from this hospitalization (including imaging,  microbiology, ancillary and laboratory) are listed below for reference.     Microbiology: Recent Results (from the past 240 hour(s))  Resp Panel by RT-PCR (Flu A&B, Covid) Nasopharyngeal Swab     Status: None   Collection Time: 04/23/21 11:57 PM   Specimen: Nasopharyngeal Swab; Nasopharyngeal(NP) swabs in vial transport medium  Result Value Ref Range Status   SARS Coronavirus 2 by RT PCR NEGATIVE NEGATIVE Final    Comment: (NOTE) SARS-CoV-2 target nucleic acids are NOT DETECTED.  The SARS-CoV-2 RNA is generally detectable in upper respiratory specimens during the acute phase of infection. The lowest concentration of SARS-CoV-2 viral copies this assay can detect is 138 copies/mL. A negative result does not preclude SARS-Cov-2 infection and should not be used as the sole basis for treatment or other patient management decisions. A negative result may occur with  improper specimen collection/handling, submission of specimen other than nasopharyngeal swab, presence of viral mutation(s) within the areas targeted by this assay, and inadequate number of viral copies(<138 copies/mL). A negative result must be combined with clinical observations, patient history, and epidemiological information. The expected result is Negative.  Fact Sheet for Patients:  EntrepreneurPulse.com.au  Fact Sheet for Healthcare Providers:  IncredibleEmployment.be  This test is no t yet approved or cleared by the [place] FDA and  has been authorized for detection and/or diagnosis of SARS-CoV-2 by FDA under an Emergency Use Authorization (EUA). This EUA will remain  in effect (meaning this test can be used) for the duration of the COVID-19 declaration under Section 564(b)(1) of the Act, 21 U.S.C.section 360bbb-3(b)(1), unless the authorization is terminated  or revoked sooner.       Influenza A by PCR NEGATIVE NEGATIVE Final   Influenza B by PCR NEGATIVE NEGATIVE  Final    Comment: (NOTE) The Xpert Xpress SARS-CoV-2/FLU/RSV plus assay is intended as an aid in the diagnosis of influenza from Nasopharyngeal swab specimens and should not be used as a sole basis for treatment. Nasal washings and aspirates are unacceptable for Xpert Xpress SARS-CoV-2/FLU/RSV testing.  Fact Sheet for Patients: EntrepreneurPulse.com.au  Fact Sheet for Healthcare Providers: IncredibleEmployment.be  This test is not yet approved or cleared by the [place] FDA and has been authorized for detection and/or diagnosis of SARS-CoV-2 by FDA under an Emergency Use Authorization (EUA). This EUA will remain in effect (meaning this test can be used) for the duration of the COVID-19 declaration under Section 564(b)(1) of the Act, 21 U.S.C. section 360bbb-3(b)(1), unless the authorization is terminated or revoked.  Performed at [facility], [address], [place], [place] [address]   Urine Culture     Status: Abnormal   Collection Time: 04/23/21 11:57 PM   Specimen: Urine, Random  Result Value Ref Range Status   Specimen Description  Final    URINE, RANDOM Performed at [facility], [address] [place], [place] [address]    Special Requests   Final    NONE Performed at [facility], [address] [place], [place] [address]    Culture MULTIPLE SPECIES PRESENT, SUGGEST RECOLLECTION (A)  Final   Report Status 04/24/2021 FINAL  Final  Resp Panel by RT-PCR (Flu A&B, Covid) Nasopharyngeal Swab     Status: None   Collection Time: 04/27/21 10:45 AM   Specimen: Nasopharyngeal Swab; Nasopharyngeal(NP) swabs in vial transport medium  Result Value Ref Range Status   SARS Coronavirus 2 by RT PCR NEGATIVE NEGATIVE Final    Comment: (NOTE) SARS-CoV-2 target nucleic acids are NOT DETECTED.  The SARS-CoV-2 RNA is generally detectable in upper respiratory specimens during the acute phase of infection. The  lowest concentration of SARS-CoV-2 viral copies this assay can detect is 138 copies/mL. A negative result does not preclude SARS-Cov-2 infection and should not be used as the sole basis for treatment or other patient management decisions. A negative result may occur with  improper specimen collection/handling, submission of specimen other than nasopharyngeal swab, presence of viral mutation(s) within the areas targeted by this assay, and inadequate number of viral copies(<138 copies/mL). A negative result must be combined with clinical observations, patient history, and epidemiological information. The expected result is Negative.  Fact Sheet for Patients:  EntrepreneurPulse.com.au  Fact Sheet for Healthcare Providers:  IncredibleEmployment.be  This test is no t yet approved or cleared by the [place] FDA and  has been authorized for detection and/or diagnosis of SARS-CoV-2 by FDA under an Emergency Use Authorization (EUA). This EUA will remain  in effect (meaning this test can be used) for the duration of the COVID-19 declaration under Section 564(b)(1) of the Act, 21 U.S.C.section 360bbb-3(b)(1), unless the authorization is terminated  or revoked sooner.       Influenza A by PCR NEGATIVE NEGATIVE Final   Influenza B by PCR NEGATIVE NEGATIVE Final    Comment: (NOTE) The Xpert Xpress SARS-CoV-2/FLU/RSV plus assay is intended as an aid in the diagnosis of influenza from Nasopharyngeal swab specimens and should not be used as a sole basis for treatment. Nasal washings and aspirates are unacceptable for Xpert Xpress SARS-CoV-2/FLU/RSV testing.  Fact Sheet for Patients: EntrepreneurPulse.com.au  Fact Sheet for Healthcare Providers: IncredibleEmployment.be  This test is not yet approved or cleared by the [place] FDA and has been authorized for detection and/or diagnosis of SARS-CoV-2 by FDA under  an Emergency Use Authorization (EUA). This EUA will remain in effect (meaning this test can be used) for the duration of the COVID-19 declaration under Section 564(b)(1) of the Act, 21 U.S.C. section 360bbb-3(b)(1), unless the authorization is terminated or revoked.  Performed at [facility], [address] [place], [place] [address]      Labs: BNP (last 3 results) No results for input(s): BNP in the last 8760 hours. Basic Metabolic Panel: Recent Labs  Lab 04/23/21 1830 04/24/21 1050 04/25/21 0531 04/26/21 0410 04/27/21 0420  NA 138  --  140 141 138  K 4.1  --  3.4* 3.8 3.5  CL 102  --  104 105 103  CO2 26  --  27 29 26   GLUCOSE 99  --  91 100* 105*  BUN 20  --  12 11 14   CREATININE 0.95 0.76 0.88 0.86 0.83  CALCIUM 8.5*  --  8.2* 8.2* 8.5*   Liver Function Tests: Recent Labs  Lab 04/23/21 1830  AST 35  ALT 27  ALKPHOS 52  BILITOT 1.0  PROT 7.0  ALBUMIN 3.7   No results for input(s): LIPASE, AMYLASE in the last 168 hours. No results for input(s): AMMONIA in the last 168 hours. CBC: Recent Labs  Lab 04/23/21 1830 04/24/21 1050 04/25/21 0531 04/26/21 0410 04/27/21 0420  WBC 7.7 7.7 6.2 7.6 7.4  NEUTROABS 5.6  --   --   --   --   HGB 13.4 13.5 13.4 13.1 13.8  HCT 38.1 38.9 40.4 39.1 41.5  MCV 91.4 90.9 93.1 91.8 91.2  PLT 237 234 206 220 235   Cardiac Enzymes: No results for input(s): CKTOTAL, CKMB, CKMBINDEX, TROPONINI in the last 168 hours. BNP: Invalid input(s): POCBNP CBG: No results for input(s): GLUCAP in the last 168 hours. D-Dimer No results for input(s): DDIMER in the last 72 hours. Hgb A1c No results for input(s): HGBA1C in the last 72 hours. Lipid Profile No results for input(s): CHOL, HDL, LDLCALC, TRIG, CHOLHDL, LDLDIRECT in the last 72 hours. Thyroid function studies No results for input(s): TSH, T4TOTAL, T3FREE, THYROIDAB in the last 72 hours.  Invalid input(s): FREET3 Anemia work up No results for input(s):  VITAMINB12, FOLATE, FERRITIN, TIBC, IRON, RETICCTPCT in the last 72 hours. Urinalysis    Component Value Date/Time   COLORURINE STRAW (A) 04/23/2021 2357   APPEARANCEUR CLEAR (A) 04/23/2021 2357   LABSPEC 1.003 (L) 04/23/2021 2357   PHURINE 6.0 04/23/2021 2357   GLUCOSEU NEGATIVE 04/23/2021 2357   GLUCOSEU NEGATIVE 01/05/2020 1559   HGBUR NEGATIVE 04/23/2021 2357   BILIRUBINUR NEGATIVE 04/23/2021 2357   KETONESUR NEGATIVE 04/23/2021 2357   PROTEINUR NEGATIVE 04/23/2021 2357   UROBILINOGEN 0.2 01/05/2020 1559   NITRITE NEGATIVE 04/23/2021 2357   LEUKOCYTESUR SMALL (A) 04/23/2021 2357   Sepsis Labs Invalid input(s): PROCALCITONIN,  WBC,  LACTICIDVEN Microbiology Recent Results (from the past 240 hour(s))  Resp Panel by RT-PCR (Flu A&B, Covid) Nasopharyngeal Swab     Status: None   Collection Time: 04/23/21 11:57 PM   Specimen: Nasopharyngeal Swab; Nasopharyngeal(NP) swabs in vial transport medium  Result Value Ref Range Status   SARS Coronavirus 2 by RT PCR NEGATIVE NEGATIVE Final    Comment: (NOTE) SARS-CoV-2 target nucleic acids are NOT DETECTED.  The SARS-CoV-2 RNA is generally detectable in upper respiratory specimens during the acute phase of infection. The lowest concentration of SARS-CoV-2 viral copies this assay can detect is 138 copies/mL. A negative result does not preclude SARS-Cov-2 infection and should not be used as the sole basis for treatment or other patient management decisions. A negative result may occur with  improper specimen collection/handling, submission of specimen other than nasopharyngeal swab, presence of viral mutation(s) within the areas targeted by this assay, and inadequate number of viral copies(<138 copies/mL). A negative result must be combined with clinical observations, patient history, and epidemiological information. The expected result is Negative.  Fact Sheet for Patients:  EntrepreneurPulse.com.au  Fact Sheet for  Healthcare Providers:  IncredibleEmployment.be  This test is no t yet approved or cleared by the [place] FDA and  has been authorized for detection and/or diagnosis of SARS-CoV-2 by FDA under an Emergency Use Authorization (EUA). This EUA will remain  in effect (meaning this test can be used) for the duration of the COVID-19 declaration under Section 564(b)(1) of the Act, 21 U.S.C.section 360bbb-3(b)(1), unless the authorization is terminated  or revoked sooner.       Influenza A by PCR NEGATIVE NEGATIVE  Final   Influenza B by PCR NEGATIVE NEGATIVE Final    Comment: (NOTE) The Xpert Xpress SARS-CoV-2/FLU/RSV plus assay is intended as an aid in the diagnosis of influenza from Nasopharyngeal swab specimens and should not be used as a sole basis for treatment. Nasal washings and aspirates are unacceptable for Xpert Xpress SARS-CoV-2/FLU/RSV testing.  Fact Sheet for Patients: EntrepreneurPulse.com.au  Fact Sheet for Healthcare Providers: IncredibleEmployment.be  This test is not yet approved or cleared by the [place] FDA and has been authorized for detection and/or diagnosis of SARS-CoV-2 by FDA under an Emergency Use Authorization (EUA). This EUA will remain in effect (meaning this test can be used) for the duration of the COVID-19 declaration under Section 564(b)(1) of the Act, 21 U.S.C. section 360bbb-3(b)(1), unless the authorization is terminated or revoked.  Performed at [facility], [address] [place], [place] [address]   Urine Culture     Status: Abnormal   Collection Time: 04/23/21 11:57 PM   Specimen: Urine, Random  Result Value Ref Range Status   Specimen Description   Final    URINE, RANDOM Performed at [facility], [address], [place], [place] [address]    Special Requests   Final    NONE Performed at [facility], [address] [place], [place]  [address]    Culture MULTIPLE SPECIES PRESENT, SUGGEST RECOLLECTION (A)  Final   Report Status 04/24/2021 FINAL  Final  Resp Panel by RT-PCR (Flu A&B, Covid) Nasopharyngeal Swab     Status: None   Collection Time: 04/27/21 10:45 AM   Specimen: Nasopharyngeal Swab; Nasopharyngeal(NP) swabs in vial transport medium  Result Value Ref Range Status   SARS Coronavirus 2 by RT PCR NEGATIVE NEGATIVE Final    Comment: (NOTE) SARS-CoV-2 target nucleic acids are NOT DETECTED.  The SARS-CoV-2 RNA is generally detectable in upper respiratory specimens during the acute phase of infection. The lowest concentration of SARS-CoV-2 viral copies this assay can detect is 138 copies/mL. A negative result does not preclude SARS-Cov-2 infection and should not be used as the sole basis for treatment or other patient management decisions. A negative result may occur with  improper specimen collection/handling, submission of specimen other than nasopharyngeal swab, presence of viral mutation(s) within the areas targeted by this assay, and inadequate number of viral copies(<138 copies/mL). A negative result must be combined with clinical observations, patient history, and epidemiological information. The expected result is Negative.  Fact Sheet for Patients:  EntrepreneurPulse.com.au  Fact Sheet for Healthcare Providers:  IncredibleEmployment.be  This test is no t yet approved or cleared by the [place] FDA and  has been authorized for detection and/or diagnosis of SARS-CoV-2 by FDA under an Emergency Use Authorization (EUA). This EUA will remain  in effect (meaning this test can be used) for the duration of the COVID-19 declaration under Section 564(b)(1) of the Act, 21 U.S.C.section 360bbb-3(b)(1), unless the authorization is terminated  or revoked sooner.       Influenza A by PCR NEGATIVE NEGATIVE Final   Influenza B by PCR NEGATIVE NEGATIVE Final    Comment:  (NOTE) The Xpert Xpress SARS-CoV-2/FLU/RSV plus assay is intended as an aid in the diagnosis of influenza from Nasopharyngeal swab specimens and should not be used as a sole basis for treatment. Nasal washings and aspirates are unacceptable for Xpert Xpress SARS-CoV-2/FLU/RSV testing.  Fact Sheet for Patients: EntrepreneurPulse.com.au  Fact Sheet for Healthcare Providers: IncredibleEmployment.be  This test is not yet approved or cleared by  the [employer] and has been authorized for detection and/or diagnosis of SARS-CoV-2 by FDA under an Emergency Use Authorization (EUA). This EUA will remain in effect (meaning this test can be used) for the duration of the COVID-19 declaration under Section 564(b)(1) of the Act, 21 U.S.C. section 360bbb-3(b)(1), unless the authorization is terminated or revoked.  Performed at [facility], [address] [place], [place] [address]      Time coordinating discharge: Over 30 minutes  SIGNED:   Wyvonnia Dusky, MD  [facility] 04/27/2021, 12:48 PM Pager   If 7PM-7AM, please contact night-coverage

## 2021-04-27 NOTE — Progress Notes (Signed)
Patient discharged today to [facility] and healthcare center via EMS transport. Report called to [facility], LPN. AVS printed and placed in discharge packet. All questions fully answered. All VSS, patient is content and in no acute distress at time of discharge. All lines and drains discontinued. Family aware of departure and will meet the patient over at new facility.

## 2021-04-27 NOTE — Progress Notes (Signed)
To whom it may concern:  Please be advised that the above named patient has a primary diagnosis of dementia that supersedes any other psychiatric diagnosis.

## 2021-04-27 NOTE — TOC Transition Note (Signed)
Transition of Care [facility]) - CM/SW Discharge Note   Patient Details  Name: Felicia Anderson MRN: [id number] Date of Birth: 1941-01-24  [facility] CM/SW Contact:  Shelbie Hutching, RN Phone Number: 04/27/2021, 1:10 PM   Clinical Narrative:    Patient is medically cleared for discharge to [facility] in [place].  Family updated on discharge today.  Patient going to room 103B.  Bedside RN will call report.  RNCM has arranged EMS and patient is 3rd on the list for transport.     Final next level of care: Skilled Nursing Facility Barriers to Discharge: Barriers Resolved   Patient Goals and CMS Choice Patient states their goals for this hospitalization and ongoing recovery are:: daughter wants rehab and then transition to LTC CMS Medicare.gov Compare Post Acute Care list provided to:: Patient Represenative (must comment) Choice offered to / list presented to : Adult Children  Discharge Placement PASRR number recieved: 04/27/21            Patient chooses bed at: [facility] Patient to be transferred to facility by: [facility] Name of family member notified: Terri Patient and family notified of of transfer: 04/27/21  Discharge Plan and Services   Discharge Planning Services: CM Consult Post Acute Care Choice: [facility]          DME Arranged: N/A DME Agency: NA       HH Arranged: NA HH Agency: NA        Social Determinants of Health (SDOH) Interventions     Readmission Risk Interventions No flowsheet data found.

## 2021-04-27 NOTE — Progress Notes (Addendum)
To whom it may concern:  Please be advised  The above named patient requires a short term nursing home stay- 30 days or less- for strengthening and rehabilitation.  The plan is for patient to return home.

## 2021-04-27 NOTE — Care Management Important Message (Signed)
Important Message  Patient Details  Name: Felicia Anderson MRN: [id number] Date of Birth: 05-28-1941   Medicare Important Message Given:  Yes     Loann Quill 04/27/2021, 1:17 PM

## 2021-05-01 DIAGNOSIS — G9341 Metabolic encephalopathy: Secondary | ICD-10-CM | POA: Diagnosis not present

## 2021-05-01 DIAGNOSIS — G309 Alzheimer's disease, unspecified: Secondary | ICD-10-CM | POA: Diagnosis not present

## 2021-05-01 DIAGNOSIS — I10 Essential (primary) hypertension: Secondary | ICD-10-CM | POA: Diagnosis not present

## 2021-05-01 DIAGNOSIS — F0280 Dementia in other diseases classified elsewhere without behavioral disturbance: Secondary | ICD-10-CM | POA: Diagnosis not present

## 2021-05-03 ENCOUNTER — Ambulatory Visit: Payer: Medicare Other

## 2021-05-04 DIAGNOSIS — W1830XA Fall on same level, unspecified, initial encounter: Secondary | ICD-10-CM | POA: Diagnosis not present

## 2021-05-04 DIAGNOSIS — S0083XA Contusion of other part of head, initial encounter: Secondary | ICD-10-CM | POA: Diagnosis not present

## 2021-05-04 DIAGNOSIS — F0390 Unspecified dementia without behavioral disturbance: Secondary | ICD-10-CM | POA: Diagnosis not present

## 2021-05-04 DIAGNOSIS — S0093XA Contusion of unspecified part of head, initial encounter: Secondary | ICD-10-CM | POA: Diagnosis not present

## 2021-05-04 DIAGNOSIS — W050XXA Fall from non-moving wheelchair, initial encounter: Secondary | ICD-10-CM | POA: Diagnosis not present

## 2021-05-07 ENCOUNTER — Telehealth: Payer: Self-pay | Admitting: Internal Medicine

## 2021-05-07 NOTE — Telephone Encounter (Signed)
Patients daughter called in to cancel her 10/25 due to her being in rehab and then she will be moving to a memory care facility in [place] and seeing a doctor at the facility.She stated it is ok to give her a call if you have any questions.

## 2021-05-07 NOTE — Telephone Encounter (Signed)
FYI

## 2021-05-15 ENCOUNTER — Ambulatory Visit: Payer: Medicare Other | Admitting: Internal Medicine

## 2021-05-17 DIAGNOSIS — G301 Alzheimer's disease with late onset: Secondary | ICD-10-CM | POA: Diagnosis not present

## 2021-05-17 DIAGNOSIS — Z79899 Other long term (current) drug therapy: Secondary | ICD-10-CM | POA: Diagnosis not present

## 2021-05-17 DIAGNOSIS — F422 Mixed obsessional thoughts and acts: Secondary | ICD-10-CM | POA: Diagnosis not present

## 2021-05-17 DIAGNOSIS — R531 Weakness: Secondary | ICD-10-CM | POA: Diagnosis not present

## 2021-05-17 DIAGNOSIS — F411 Generalized anxiety disorder: Secondary | ICD-10-CM | POA: Diagnosis not present

## 2021-05-17 DIAGNOSIS — Z87440 Personal history of urinary (tract) infections: Secondary | ICD-10-CM | POA: Diagnosis not present

## 2021-05-17 DIAGNOSIS — Z9181 History of falling: Secondary | ICD-10-CM | POA: Diagnosis not present

## 2021-05-17 DIAGNOSIS — F02818 Dementia in other diseases classified elsewhere, unspecified severity, with other behavioral disturbance: Secondary | ICD-10-CM | POA: Diagnosis not present

## 2021-05-18 DIAGNOSIS — F29 Unspecified psychosis not due to a substance or known physiological condition: Secondary | ICD-10-CM | POA: Diagnosis not present

## 2021-05-18 DIAGNOSIS — H524 Presbyopia: Secondary | ICD-10-CM | POA: Diagnosis not present

## 2021-05-18 DIAGNOSIS — H25813 Combined forms of age-related cataract, bilateral: Secondary | ICD-10-CM | POA: Diagnosis not present

## 2021-05-18 DIAGNOSIS — Z79899 Other long term (current) drug therapy: Secondary | ICD-10-CM | POA: Diagnosis not present

## 2021-05-18 DIAGNOSIS — G309 Alzheimer's disease, unspecified: Secondary | ICD-10-CM | POA: Diagnosis not present

## 2021-05-24 DIAGNOSIS — H903 Sensorineural hearing loss, bilateral: Secondary | ICD-10-CM | POA: Diagnosis not present

## 2021-05-30 ENCOUNTER — Ambulatory Visit: Payer: Medicare Other | Admitting: Podiatry

## 2021-05-31 ENCOUNTER — Ambulatory Visit: Payer: Medicare Other | Admitting: Podiatry

## 2021-05-31 DIAGNOSIS — G309 Alzheimer's disease, unspecified: Secondary | ICD-10-CM | POA: Diagnosis not present

## 2021-05-31 DIAGNOSIS — N390 Urinary tract infection, site not specified: Secondary | ICD-10-CM | POA: Diagnosis not present

## 2021-05-31 DIAGNOSIS — I952 Hypotension due to drugs: Secondary | ICD-10-CM | POA: Diagnosis not present

## 2021-06-16 DIAGNOSIS — R262 Difficulty in walking, not elsewhere classified: Secondary | ICD-10-CM | POA: Diagnosis not present

## 2021-06-16 DIAGNOSIS — M6281 Muscle weakness (generalized): Secondary | ICD-10-CM | POA: Diagnosis not present

## 2021-06-16 DIAGNOSIS — Z9181 History of falling: Secondary | ICD-10-CM | POA: Diagnosis not present

## 2021-06-21 DIAGNOSIS — F411 Generalized anxiety disorder: Secondary | ICD-10-CM | POA: Diagnosis not present

## 2021-06-21 DIAGNOSIS — F02C11 Dementia in other diseases classified elsewhere, severe, with agitation: Secondary | ICD-10-CM | POA: Diagnosis not present

## 2021-06-21 DIAGNOSIS — F332 Major depressive disorder, recurrent severe without psychotic features: Secondary | ICD-10-CM | POA: Diagnosis not present

## 2021-06-21 DIAGNOSIS — G4709 Other insomnia: Secondary | ICD-10-CM | POA: Diagnosis not present

## 2021-06-22 DIAGNOSIS — G309 Alzheimer's disease, unspecified: Secondary | ICD-10-CM | POA: Diagnosis not present

## 2021-06-22 DIAGNOSIS — I10 Essential (primary) hypertension: Secondary | ICD-10-CM | POA: Diagnosis not present

## 2021-06-22 DIAGNOSIS — F29 Unspecified psychosis not due to a substance or known physiological condition: Secondary | ICD-10-CM | POA: Diagnosis not present

## 2021-07-03 DIAGNOSIS — G4709 Other insomnia: Secondary | ICD-10-CM | POA: Diagnosis not present

## 2021-07-03 DIAGNOSIS — F332 Major depressive disorder, recurrent severe without psychotic features: Secondary | ICD-10-CM | POA: Diagnosis not present

## 2021-07-03 DIAGNOSIS — G8911 Acute pain due to trauma: Secondary | ICD-10-CM | POA: Diagnosis not present

## 2021-07-03 DIAGNOSIS — F03C11 Unspecified dementia, severe, with agitation: Secondary | ICD-10-CM | POA: Diagnosis not present

## 2021-07-03 DIAGNOSIS — F411 Generalized anxiety disorder: Secondary | ICD-10-CM | POA: Diagnosis not present

## 2021-07-08 DIAGNOSIS — I10 Essential (primary) hypertension: Secondary | ICD-10-CM | POA: Diagnosis not present

## 2021-07-12 DIAGNOSIS — R1312 Dysphagia, oropharyngeal phase: Secondary | ICD-10-CM | POA: Diagnosis not present

## 2021-07-20 DIAGNOSIS — R1312 Dysphagia, oropharyngeal phase: Secondary | ICD-10-CM | POA: Diagnosis not present

## 2021-07-24 DIAGNOSIS — R1312 Dysphagia, oropharyngeal phase: Secondary | ICD-10-CM | POA: Diagnosis not present

## 2021-07-24 DIAGNOSIS — Z741 Need for assistance with personal care: Secondary | ICD-10-CM | POA: Diagnosis not present

## 2021-07-24 DIAGNOSIS — M6281 Muscle weakness (generalized): Secondary | ICD-10-CM | POA: Diagnosis not present

## 2021-07-25 DIAGNOSIS — Z741 Need for assistance with personal care: Secondary | ICD-10-CM | POA: Diagnosis not present

## 2021-07-25 DIAGNOSIS — M6281 Muscle weakness (generalized): Secondary | ICD-10-CM | POA: Diagnosis not present

## 2021-07-25 DIAGNOSIS — R1312 Dysphagia, oropharyngeal phase: Secondary | ICD-10-CM | POA: Diagnosis not present

## 2021-07-26 DIAGNOSIS — R1312 Dysphagia, oropharyngeal phase: Secondary | ICD-10-CM | POA: Diagnosis not present

## 2021-07-26 DIAGNOSIS — Z741 Need for assistance with personal care: Secondary | ICD-10-CM | POA: Diagnosis not present

## 2021-07-26 DIAGNOSIS — M6281 Muscle weakness (generalized): Secondary | ICD-10-CM | POA: Diagnosis not present

## 2021-07-27 DIAGNOSIS — R1312 Dysphagia, oropharyngeal phase: Secondary | ICD-10-CM | POA: Diagnosis not present

## 2021-07-27 DIAGNOSIS — Z741 Need for assistance with personal care: Secondary | ICD-10-CM | POA: Diagnosis not present

## 2021-07-27 DIAGNOSIS — M6281 Muscle weakness (generalized): Secondary | ICD-10-CM | POA: Diagnosis not present

## 2021-07-30 DIAGNOSIS — R1312 Dysphagia, oropharyngeal phase: Secondary | ICD-10-CM | POA: Diagnosis not present

## 2021-07-30 DIAGNOSIS — M6281 Muscle weakness (generalized): Secondary | ICD-10-CM | POA: Diagnosis not present

## 2021-07-30 DIAGNOSIS — Z741 Need for assistance with personal care: Secondary | ICD-10-CM | POA: Diagnosis not present

## 2021-07-31 DIAGNOSIS — Z741 Need for assistance with personal care: Secondary | ICD-10-CM | POA: Diagnosis not present

## 2021-07-31 DIAGNOSIS — R1312 Dysphagia, oropharyngeal phase: Secondary | ICD-10-CM | POA: Diagnosis not present

## 2021-07-31 DIAGNOSIS — M6281 Muscle weakness (generalized): Secondary | ICD-10-CM | POA: Diagnosis not present

## 2021-08-01 DIAGNOSIS — Z741 Need for assistance with personal care: Secondary | ICD-10-CM | POA: Diagnosis not present

## 2021-08-01 DIAGNOSIS — R1312 Dysphagia, oropharyngeal phase: Secondary | ICD-10-CM | POA: Diagnosis not present

## 2021-08-01 DIAGNOSIS — M6281 Muscle weakness (generalized): Secondary | ICD-10-CM | POA: Diagnosis not present

## 2021-08-02 DIAGNOSIS — R1312 Dysphagia, oropharyngeal phase: Secondary | ICD-10-CM | POA: Diagnosis not present

## 2021-08-02 DIAGNOSIS — Z741 Need for assistance with personal care: Secondary | ICD-10-CM | POA: Diagnosis not present

## 2021-08-02 DIAGNOSIS — M6281 Muscle weakness (generalized): Secondary | ICD-10-CM | POA: Diagnosis not present

## 2021-08-03 DIAGNOSIS — Z741 Need for assistance with personal care: Secondary | ICD-10-CM | POA: Diagnosis not present

## 2021-08-03 DIAGNOSIS — M6281 Muscle weakness (generalized): Secondary | ICD-10-CM | POA: Diagnosis not present

## 2021-08-03 DIAGNOSIS — R1312 Dysphagia, oropharyngeal phase: Secondary | ICD-10-CM | POA: Diagnosis not present

## 2021-08-04 DIAGNOSIS — R1312 Dysphagia, oropharyngeal phase: Secondary | ICD-10-CM | POA: Diagnosis not present

## 2021-08-04 DIAGNOSIS — Z741 Need for assistance with personal care: Secondary | ICD-10-CM | POA: Diagnosis not present

## 2021-08-04 DIAGNOSIS — M6281 Muscle weakness (generalized): Secondary | ICD-10-CM | POA: Diagnosis not present

## 2021-08-06 DIAGNOSIS — Z741 Need for assistance with personal care: Secondary | ICD-10-CM | POA: Diagnosis not present

## 2021-08-06 DIAGNOSIS — M6281 Muscle weakness (generalized): Secondary | ICD-10-CM | POA: Diagnosis not present

## 2021-08-06 DIAGNOSIS — R1312 Dysphagia, oropharyngeal phase: Secondary | ICD-10-CM | POA: Diagnosis not present

## 2021-08-07 DIAGNOSIS — Z741 Need for assistance with personal care: Secondary | ICD-10-CM | POA: Diagnosis not present

## 2021-08-07 DIAGNOSIS — M6281 Muscle weakness (generalized): Secondary | ICD-10-CM | POA: Diagnosis not present

## 2021-08-07 DIAGNOSIS — R1312 Dysphagia, oropharyngeal phase: Secondary | ICD-10-CM | POA: Diagnosis not present

## 2021-08-08 DIAGNOSIS — M6281 Muscle weakness (generalized): Secondary | ICD-10-CM | POA: Diagnosis not present

## 2021-08-08 DIAGNOSIS — R1312 Dysphagia, oropharyngeal phase: Secondary | ICD-10-CM | POA: Diagnosis not present

## 2021-08-08 DIAGNOSIS — Z741 Need for assistance with personal care: Secondary | ICD-10-CM | POA: Diagnosis not present

## 2021-08-09 DIAGNOSIS — R1312 Dysphagia, oropharyngeal phase: Secondary | ICD-10-CM | POA: Diagnosis not present

## 2021-08-09 DIAGNOSIS — M6281 Muscle weakness (generalized): Secondary | ICD-10-CM | POA: Diagnosis not present

## 2021-08-09 DIAGNOSIS — Z741 Need for assistance with personal care: Secondary | ICD-10-CM | POA: Diagnosis not present

## 2021-08-10 DIAGNOSIS — M6281 Muscle weakness (generalized): Secondary | ICD-10-CM | POA: Diagnosis not present

## 2021-08-10 DIAGNOSIS — Z741 Need for assistance with personal care: Secondary | ICD-10-CM | POA: Diagnosis not present

## 2021-08-10 DIAGNOSIS — R1312 Dysphagia, oropharyngeal phase: Secondary | ICD-10-CM | POA: Diagnosis not present

## 2021-08-13 DIAGNOSIS — Z741 Need for assistance with personal care: Secondary | ICD-10-CM | POA: Diagnosis not present

## 2021-08-13 DIAGNOSIS — M6281 Muscle weakness (generalized): Secondary | ICD-10-CM | POA: Diagnosis not present

## 2021-08-13 DIAGNOSIS — R1312 Dysphagia, oropharyngeal phase: Secondary | ICD-10-CM | POA: Diagnosis not present

## 2021-08-14 DIAGNOSIS — Z741 Need for assistance with personal care: Secondary | ICD-10-CM | POA: Diagnosis not present

## 2021-08-14 DIAGNOSIS — R1312 Dysphagia, oropharyngeal phase: Secondary | ICD-10-CM | POA: Diagnosis not present

## 2021-08-14 DIAGNOSIS — M6281 Muscle weakness (generalized): Secondary | ICD-10-CM | POA: Diagnosis not present

## 2021-08-15 DIAGNOSIS — Z741 Need for assistance with personal care: Secondary | ICD-10-CM | POA: Diagnosis not present

## 2021-08-15 DIAGNOSIS — M6281 Muscle weakness (generalized): Secondary | ICD-10-CM | POA: Diagnosis not present

## 2021-08-15 DIAGNOSIS — R1312 Dysphagia, oropharyngeal phase: Secondary | ICD-10-CM | POA: Diagnosis not present

## 2021-08-16 DIAGNOSIS — G4709 Other insomnia: Secondary | ICD-10-CM | POA: Diagnosis not present

## 2021-08-16 DIAGNOSIS — F03C11 Unspecified dementia, severe, with agitation: Secondary | ICD-10-CM | POA: Diagnosis not present

## 2021-08-16 DIAGNOSIS — Z741 Need for assistance with personal care: Secondary | ICD-10-CM | POA: Diagnosis not present

## 2021-08-16 DIAGNOSIS — F411 Generalized anxiety disorder: Secondary | ICD-10-CM | POA: Diagnosis not present

## 2021-08-16 DIAGNOSIS — M6281 Muscle weakness (generalized): Secondary | ICD-10-CM | POA: Diagnosis not present

## 2021-08-16 DIAGNOSIS — R1312 Dysphagia, oropharyngeal phase: Secondary | ICD-10-CM | POA: Diagnosis not present

## 2021-08-16 DIAGNOSIS — F332 Major depressive disorder, recurrent severe without psychotic features: Secondary | ICD-10-CM | POA: Diagnosis not present

## 2021-08-17 DIAGNOSIS — Z741 Need for assistance with personal care: Secondary | ICD-10-CM | POA: Diagnosis not present

## 2021-08-17 DIAGNOSIS — R1312 Dysphagia, oropharyngeal phase: Secondary | ICD-10-CM | POA: Diagnosis not present

## 2021-08-17 DIAGNOSIS — M6281 Muscle weakness (generalized): Secondary | ICD-10-CM | POA: Diagnosis not present

## 2021-08-20 DIAGNOSIS — R1312 Dysphagia, oropharyngeal phase: Secondary | ICD-10-CM | POA: Diagnosis not present

## 2021-08-20 DIAGNOSIS — Z741 Need for assistance with personal care: Secondary | ICD-10-CM | POA: Diagnosis not present

## 2021-08-20 DIAGNOSIS — M6281 Muscle weakness (generalized): Secondary | ICD-10-CM | POA: Diagnosis not present

## 2021-08-21 DIAGNOSIS — M6281 Muscle weakness (generalized): Secondary | ICD-10-CM | POA: Diagnosis not present

## 2021-08-21 DIAGNOSIS — R1312 Dysphagia, oropharyngeal phase: Secondary | ICD-10-CM | POA: Diagnosis not present

## 2021-08-21 DIAGNOSIS — Z741 Need for assistance with personal care: Secondary | ICD-10-CM | POA: Diagnosis not present

## 2021-08-22 DIAGNOSIS — Z741 Need for assistance with personal care: Secondary | ICD-10-CM | POA: Diagnosis not present

## 2021-08-22 DIAGNOSIS — G9341 Metabolic encephalopathy: Secondary | ICD-10-CM | POA: Diagnosis not present

## 2021-08-22 DIAGNOSIS — M6281 Muscle weakness (generalized): Secondary | ICD-10-CM | POA: Diagnosis not present

## 2021-08-22 DIAGNOSIS — R1312 Dysphagia, oropharyngeal phase: Secondary | ICD-10-CM | POA: Diagnosis not present

## 2021-08-22 DIAGNOSIS — R2689 Other abnormalities of gait and mobility: Secondary | ICD-10-CM | POA: Diagnosis not present

## 2021-08-23 DIAGNOSIS — R1312 Dysphagia, oropharyngeal phase: Secondary | ICD-10-CM | POA: Diagnosis not present

## 2021-08-23 DIAGNOSIS — Z741 Need for assistance with personal care: Secondary | ICD-10-CM | POA: Diagnosis not present

## 2021-08-23 DIAGNOSIS — G9341 Metabolic encephalopathy: Secondary | ICD-10-CM | POA: Diagnosis not present

## 2021-08-23 DIAGNOSIS — M6281 Muscle weakness (generalized): Secondary | ICD-10-CM | POA: Diagnosis not present

## 2021-08-23 DIAGNOSIS — R2689 Other abnormalities of gait and mobility: Secondary | ICD-10-CM | POA: Diagnosis not present

## 2021-08-24 DIAGNOSIS — R1312 Dysphagia, oropharyngeal phase: Secondary | ICD-10-CM | POA: Diagnosis not present

## 2021-08-24 DIAGNOSIS — G9341 Metabolic encephalopathy: Secondary | ICD-10-CM | POA: Diagnosis not present

## 2021-08-24 DIAGNOSIS — M6281 Muscle weakness (generalized): Secondary | ICD-10-CM | POA: Diagnosis not present

## 2021-08-24 DIAGNOSIS — R2689 Other abnormalities of gait and mobility: Secondary | ICD-10-CM | POA: Diagnosis not present

## 2021-08-24 DIAGNOSIS — Z741 Need for assistance with personal care: Secondary | ICD-10-CM | POA: Diagnosis not present

## 2021-08-25 DIAGNOSIS — G9341 Metabolic encephalopathy: Secondary | ICD-10-CM | POA: Diagnosis not present

## 2021-08-25 DIAGNOSIS — Z741 Need for assistance with personal care: Secondary | ICD-10-CM | POA: Diagnosis not present

## 2021-08-25 DIAGNOSIS — R1312 Dysphagia, oropharyngeal phase: Secondary | ICD-10-CM | POA: Diagnosis not present

## 2021-08-25 DIAGNOSIS — M6281 Muscle weakness (generalized): Secondary | ICD-10-CM | POA: Diagnosis not present

## 2021-08-25 DIAGNOSIS — R2689 Other abnormalities of gait and mobility: Secondary | ICD-10-CM | POA: Diagnosis not present

## 2021-08-27 DIAGNOSIS — Z741 Need for assistance with personal care: Secondary | ICD-10-CM | POA: Diagnosis not present

## 2021-08-27 DIAGNOSIS — R1312 Dysphagia, oropharyngeal phase: Secondary | ICD-10-CM | POA: Diagnosis not present

## 2021-08-27 DIAGNOSIS — G9341 Metabolic encephalopathy: Secondary | ICD-10-CM | POA: Diagnosis not present

## 2021-08-27 DIAGNOSIS — M6281 Muscle weakness (generalized): Secondary | ICD-10-CM | POA: Diagnosis not present

## 2021-08-27 DIAGNOSIS — R2689 Other abnormalities of gait and mobility: Secondary | ICD-10-CM | POA: Diagnosis not present

## 2021-08-28 DIAGNOSIS — G9341 Metabolic encephalopathy: Secondary | ICD-10-CM | POA: Diagnosis not present

## 2021-08-28 DIAGNOSIS — Z741 Need for assistance with personal care: Secondary | ICD-10-CM | POA: Diagnosis not present

## 2021-08-28 DIAGNOSIS — R2689 Other abnormalities of gait and mobility: Secondary | ICD-10-CM | POA: Diagnosis not present

## 2021-08-28 DIAGNOSIS — M6281 Muscle weakness (generalized): Secondary | ICD-10-CM | POA: Diagnosis not present

## 2021-08-28 DIAGNOSIS — R1312 Dysphagia, oropharyngeal phase: Secondary | ICD-10-CM | POA: Diagnosis not present

## 2021-08-29 DIAGNOSIS — G9341 Metabolic encephalopathy: Secondary | ICD-10-CM | POA: Diagnosis not present

## 2021-08-29 DIAGNOSIS — R2689 Other abnormalities of gait and mobility: Secondary | ICD-10-CM | POA: Diagnosis not present

## 2021-08-29 DIAGNOSIS — M6281 Muscle weakness (generalized): Secondary | ICD-10-CM | POA: Diagnosis not present

## 2021-08-29 DIAGNOSIS — Z741 Need for assistance with personal care: Secondary | ICD-10-CM | POA: Diagnosis not present

## 2021-08-29 DIAGNOSIS — R1312 Dysphagia, oropharyngeal phase: Secondary | ICD-10-CM | POA: Diagnosis not present

## 2021-08-30 DIAGNOSIS — M6281 Muscle weakness (generalized): Secondary | ICD-10-CM | POA: Diagnosis not present

## 2021-08-30 DIAGNOSIS — G9341 Metabolic encephalopathy: Secondary | ICD-10-CM | POA: Diagnosis not present

## 2021-08-30 DIAGNOSIS — R2689 Other abnormalities of gait and mobility: Secondary | ICD-10-CM | POA: Diagnosis not present

## 2021-08-30 DIAGNOSIS — Z741 Need for assistance with personal care: Secondary | ICD-10-CM | POA: Diagnosis not present

## 2021-08-30 DIAGNOSIS — R1312 Dysphagia, oropharyngeal phase: Secondary | ICD-10-CM | POA: Diagnosis not present

## 2021-08-31 DIAGNOSIS — R2689 Other abnormalities of gait and mobility: Secondary | ICD-10-CM | POA: Diagnosis not present

## 2021-08-31 DIAGNOSIS — G9341 Metabolic encephalopathy: Secondary | ICD-10-CM | POA: Diagnosis not present

## 2021-08-31 DIAGNOSIS — R1312 Dysphagia, oropharyngeal phase: Secondary | ICD-10-CM | POA: Diagnosis not present

## 2021-08-31 DIAGNOSIS — Z741 Need for assistance with personal care: Secondary | ICD-10-CM | POA: Diagnosis not present

## 2021-08-31 DIAGNOSIS — M6281 Muscle weakness (generalized): Secondary | ICD-10-CM | POA: Diagnosis not present

## 2021-09-01 DIAGNOSIS — G9341 Metabolic encephalopathy: Secondary | ICD-10-CM | POA: Diagnosis not present

## 2021-09-01 DIAGNOSIS — M6281 Muscle weakness (generalized): Secondary | ICD-10-CM | POA: Diagnosis not present

## 2021-09-01 DIAGNOSIS — Z741 Need for assistance with personal care: Secondary | ICD-10-CM | POA: Diagnosis not present

## 2021-09-01 DIAGNOSIS — R2689 Other abnormalities of gait and mobility: Secondary | ICD-10-CM | POA: Diagnosis not present

## 2021-09-01 DIAGNOSIS — R1312 Dysphagia, oropharyngeal phase: Secondary | ICD-10-CM | POA: Diagnosis not present

## 2021-09-03 DIAGNOSIS — Z741 Need for assistance with personal care: Secondary | ICD-10-CM | POA: Diagnosis not present

## 2021-09-03 DIAGNOSIS — R1312 Dysphagia, oropharyngeal phase: Secondary | ICD-10-CM | POA: Diagnosis not present

## 2021-09-03 DIAGNOSIS — M6281 Muscle weakness (generalized): Secondary | ICD-10-CM | POA: Diagnosis not present

## 2021-09-03 DIAGNOSIS — R2689 Other abnormalities of gait and mobility: Secondary | ICD-10-CM | POA: Diagnosis not present

## 2021-09-03 DIAGNOSIS — G9341 Metabolic encephalopathy: Secondary | ICD-10-CM | POA: Diagnosis not present

## 2021-09-04 ENCOUNTER — Ambulatory Visit: Payer: Medicare Other

## 2021-09-04 DIAGNOSIS — M6281 Muscle weakness (generalized): Secondary | ICD-10-CM | POA: Diagnosis not present

## 2021-09-04 DIAGNOSIS — R1312 Dysphagia, oropharyngeal phase: Secondary | ICD-10-CM | POA: Diagnosis not present

## 2021-09-04 DIAGNOSIS — R2689 Other abnormalities of gait and mobility: Secondary | ICD-10-CM | POA: Diagnosis not present

## 2021-09-04 DIAGNOSIS — G9341 Metabolic encephalopathy: Secondary | ICD-10-CM | POA: Diagnosis not present

## 2021-09-04 DIAGNOSIS — Z741 Need for assistance with personal care: Secondary | ICD-10-CM | POA: Diagnosis not present

## 2021-09-05 DIAGNOSIS — R1312 Dysphagia, oropharyngeal phase: Secondary | ICD-10-CM | POA: Diagnosis not present

## 2021-09-05 DIAGNOSIS — M6281 Muscle weakness (generalized): Secondary | ICD-10-CM | POA: Diagnosis not present

## 2021-09-05 DIAGNOSIS — R2689 Other abnormalities of gait and mobility: Secondary | ICD-10-CM | POA: Diagnosis not present

## 2021-09-05 DIAGNOSIS — Z741 Need for assistance with personal care: Secondary | ICD-10-CM | POA: Diagnosis not present

## 2021-09-05 DIAGNOSIS — G9341 Metabolic encephalopathy: Secondary | ICD-10-CM | POA: Diagnosis not present

## 2021-09-06 DIAGNOSIS — R1312 Dysphagia, oropharyngeal phase: Secondary | ICD-10-CM | POA: Diagnosis not present

## 2021-09-06 DIAGNOSIS — Z741 Need for assistance with personal care: Secondary | ICD-10-CM | POA: Diagnosis not present

## 2021-09-06 DIAGNOSIS — R2689 Other abnormalities of gait and mobility: Secondary | ICD-10-CM | POA: Diagnosis not present

## 2021-09-06 DIAGNOSIS — G9341 Metabolic encephalopathy: Secondary | ICD-10-CM | POA: Diagnosis not present

## 2021-09-06 DIAGNOSIS — M6281 Muscle weakness (generalized): Secondary | ICD-10-CM | POA: Diagnosis not present

## 2021-09-07 DIAGNOSIS — R1312 Dysphagia, oropharyngeal phase: Secondary | ICD-10-CM | POA: Diagnosis not present

## 2021-09-07 DIAGNOSIS — Z741 Need for assistance with personal care: Secondary | ICD-10-CM | POA: Diagnosis not present

## 2021-09-07 DIAGNOSIS — M6281 Muscle weakness (generalized): Secondary | ICD-10-CM | POA: Diagnosis not present

## 2021-09-07 DIAGNOSIS — R2689 Other abnormalities of gait and mobility: Secondary | ICD-10-CM | POA: Diagnosis not present

## 2021-09-07 DIAGNOSIS — G9341 Metabolic encephalopathy: Secondary | ICD-10-CM | POA: Diagnosis not present

## 2021-09-08 DIAGNOSIS — R1312 Dysphagia, oropharyngeal phase: Secondary | ICD-10-CM | POA: Diagnosis not present

## 2021-09-08 DIAGNOSIS — R2689 Other abnormalities of gait and mobility: Secondary | ICD-10-CM | POA: Diagnosis not present

## 2021-09-08 DIAGNOSIS — Z741 Need for assistance with personal care: Secondary | ICD-10-CM | POA: Diagnosis not present

## 2021-09-08 DIAGNOSIS — G9341 Metabolic encephalopathy: Secondary | ICD-10-CM | POA: Diagnosis not present

## 2021-09-08 DIAGNOSIS — M6281 Muscle weakness (generalized): Secondary | ICD-10-CM | POA: Diagnosis not present

## 2021-09-11 DIAGNOSIS — R1312 Dysphagia, oropharyngeal phase: Secondary | ICD-10-CM | POA: Diagnosis not present

## 2021-09-11 DIAGNOSIS — R2689 Other abnormalities of gait and mobility: Secondary | ICD-10-CM | POA: Diagnosis not present

## 2021-09-11 DIAGNOSIS — Z741 Need for assistance with personal care: Secondary | ICD-10-CM | POA: Diagnosis not present

## 2021-09-11 DIAGNOSIS — M6281 Muscle weakness (generalized): Secondary | ICD-10-CM | POA: Diagnosis not present

## 2021-09-11 DIAGNOSIS — G9341 Metabolic encephalopathy: Secondary | ICD-10-CM | POA: Diagnosis not present

## 2021-09-12 DIAGNOSIS — M6281 Muscle weakness (generalized): Secondary | ICD-10-CM | POA: Diagnosis not present

## 2021-09-12 DIAGNOSIS — R2689 Other abnormalities of gait and mobility: Secondary | ICD-10-CM | POA: Diagnosis not present

## 2021-09-12 DIAGNOSIS — G9341 Metabolic encephalopathy: Secondary | ICD-10-CM | POA: Diagnosis not present

## 2021-09-12 DIAGNOSIS — Z741 Need for assistance with personal care: Secondary | ICD-10-CM | POA: Diagnosis not present

## 2021-09-12 DIAGNOSIS — R1312 Dysphagia, oropharyngeal phase: Secondary | ICD-10-CM | POA: Diagnosis not present

## 2021-09-13 DIAGNOSIS — M6281 Muscle weakness (generalized): Secondary | ICD-10-CM | POA: Diagnosis not present

## 2021-09-13 DIAGNOSIS — R2689 Other abnormalities of gait and mobility: Secondary | ICD-10-CM | POA: Diagnosis not present

## 2021-09-13 DIAGNOSIS — Z741 Need for assistance with personal care: Secondary | ICD-10-CM | POA: Diagnosis not present

## 2021-09-13 DIAGNOSIS — G9341 Metabolic encephalopathy: Secondary | ICD-10-CM | POA: Diagnosis not present

## 2021-09-13 DIAGNOSIS — R1312 Dysphagia, oropharyngeal phase: Secondary | ICD-10-CM | POA: Diagnosis not present

## 2021-09-14 DIAGNOSIS — M6281 Muscle weakness (generalized): Secondary | ICD-10-CM | POA: Diagnosis not present

## 2021-09-14 DIAGNOSIS — G9341 Metabolic encephalopathy: Secondary | ICD-10-CM | POA: Diagnosis not present

## 2021-09-14 DIAGNOSIS — R2689 Other abnormalities of gait and mobility: Secondary | ICD-10-CM | POA: Diagnosis not present

## 2021-09-14 DIAGNOSIS — Z741 Need for assistance with personal care: Secondary | ICD-10-CM | POA: Diagnosis not present

## 2021-09-14 DIAGNOSIS — R1312 Dysphagia, oropharyngeal phase: Secondary | ICD-10-CM | POA: Diagnosis not present

## 2021-09-17 DIAGNOSIS — M6281 Muscle weakness (generalized): Secondary | ICD-10-CM | POA: Diagnosis not present

## 2021-09-17 DIAGNOSIS — R2689 Other abnormalities of gait and mobility: Secondary | ICD-10-CM | POA: Diagnosis not present

## 2021-09-17 DIAGNOSIS — G9341 Metabolic encephalopathy: Secondary | ICD-10-CM | POA: Diagnosis not present

## 2021-09-17 DIAGNOSIS — R1312 Dysphagia, oropharyngeal phase: Secondary | ICD-10-CM | POA: Diagnosis not present

## 2021-09-17 DIAGNOSIS — Z741 Need for assistance with personal care: Secondary | ICD-10-CM | POA: Diagnosis not present

## 2021-09-18 DIAGNOSIS — G9341 Metabolic encephalopathy: Secondary | ICD-10-CM | POA: Diagnosis not present

## 2021-09-18 DIAGNOSIS — M6281 Muscle weakness (generalized): Secondary | ICD-10-CM | POA: Diagnosis not present

## 2021-09-18 DIAGNOSIS — G4709 Other insomnia: Secondary | ICD-10-CM | POA: Diagnosis not present

## 2021-09-18 DIAGNOSIS — F332 Major depressive disorder, recurrent severe without psychotic features: Secondary | ICD-10-CM | POA: Diagnosis not present

## 2021-09-18 DIAGNOSIS — R2689 Other abnormalities of gait and mobility: Secondary | ICD-10-CM | POA: Diagnosis not present

## 2021-09-18 DIAGNOSIS — F03C11 Unspecified dementia, severe, with agitation: Secondary | ICD-10-CM | POA: Diagnosis not present

## 2021-09-18 DIAGNOSIS — R1312 Dysphagia, oropharyngeal phase: Secondary | ICD-10-CM | POA: Diagnosis not present

## 2021-09-18 DIAGNOSIS — F411 Generalized anxiety disorder: Secondary | ICD-10-CM | POA: Diagnosis not present

## 2021-09-18 DIAGNOSIS — Z741 Need for assistance with personal care: Secondary | ICD-10-CM | POA: Diagnosis not present

## 2021-09-19 DIAGNOSIS — R1312 Dysphagia, oropharyngeal phase: Secondary | ICD-10-CM | POA: Diagnosis not present

## 2021-09-19 DIAGNOSIS — M6281 Muscle weakness (generalized): Secondary | ICD-10-CM | POA: Diagnosis not present

## 2021-09-19 DIAGNOSIS — G9341 Metabolic encephalopathy: Secondary | ICD-10-CM | POA: Diagnosis not present

## 2021-09-19 DIAGNOSIS — R2689 Other abnormalities of gait and mobility: Secondary | ICD-10-CM | POA: Diagnosis not present

## 2021-09-20 DIAGNOSIS — M6281 Muscle weakness (generalized): Secondary | ICD-10-CM | POA: Diagnosis not present

## 2021-09-20 DIAGNOSIS — G9341 Metabolic encephalopathy: Secondary | ICD-10-CM | POA: Diagnosis not present

## 2021-09-20 DIAGNOSIS — R2689 Other abnormalities of gait and mobility: Secondary | ICD-10-CM | POA: Diagnosis not present

## 2021-09-20 DIAGNOSIS — R1312 Dysphagia, oropharyngeal phase: Secondary | ICD-10-CM | POA: Diagnosis not present

## 2021-09-21 DIAGNOSIS — R1312 Dysphagia, oropharyngeal phase: Secondary | ICD-10-CM | POA: Diagnosis not present

## 2021-09-21 DIAGNOSIS — G9341 Metabolic encephalopathy: Secondary | ICD-10-CM | POA: Diagnosis not present

## 2021-09-21 DIAGNOSIS — M6281 Muscle weakness (generalized): Secondary | ICD-10-CM | POA: Diagnosis not present

## 2021-09-21 DIAGNOSIS — R2689 Other abnormalities of gait and mobility: Secondary | ICD-10-CM | POA: Diagnosis not present

## 2021-09-22 DIAGNOSIS — G9341 Metabolic encephalopathy: Secondary | ICD-10-CM | POA: Diagnosis not present

## 2021-09-22 DIAGNOSIS — M6281 Muscle weakness (generalized): Secondary | ICD-10-CM | POA: Diagnosis not present

## 2021-09-22 DIAGNOSIS — R1312 Dysphagia, oropharyngeal phase: Secondary | ICD-10-CM | POA: Diagnosis not present

## 2021-09-22 DIAGNOSIS — R2689 Other abnormalities of gait and mobility: Secondary | ICD-10-CM | POA: Diagnosis not present

## 2021-09-24 DIAGNOSIS — M6281 Muscle weakness (generalized): Secondary | ICD-10-CM | POA: Diagnosis not present

## 2021-09-24 DIAGNOSIS — R1312 Dysphagia, oropharyngeal phase: Secondary | ICD-10-CM | POA: Diagnosis not present

## 2021-09-24 DIAGNOSIS — R2689 Other abnormalities of gait and mobility: Secondary | ICD-10-CM | POA: Diagnosis not present

## 2021-09-24 DIAGNOSIS — G9341 Metabolic encephalopathy: Secondary | ICD-10-CM | POA: Diagnosis not present

## 2021-09-25 DIAGNOSIS — R1312 Dysphagia, oropharyngeal phase: Secondary | ICD-10-CM | POA: Diagnosis not present

## 2021-09-25 DIAGNOSIS — M6281 Muscle weakness (generalized): Secondary | ICD-10-CM | POA: Diagnosis not present

## 2021-09-25 DIAGNOSIS — R2689 Other abnormalities of gait and mobility: Secondary | ICD-10-CM | POA: Diagnosis not present

## 2021-09-25 DIAGNOSIS — G9341 Metabolic encephalopathy: Secondary | ICD-10-CM | POA: Diagnosis not present

## 2021-09-26 DIAGNOSIS — G9341 Metabolic encephalopathy: Secondary | ICD-10-CM | POA: Diagnosis not present

## 2021-09-26 DIAGNOSIS — R1312 Dysphagia, oropharyngeal phase: Secondary | ICD-10-CM | POA: Diagnosis not present

## 2021-09-26 DIAGNOSIS — R2689 Other abnormalities of gait and mobility: Secondary | ICD-10-CM | POA: Diagnosis not present

## 2021-09-26 DIAGNOSIS — M6281 Muscle weakness (generalized): Secondary | ICD-10-CM | POA: Diagnosis not present

## 2021-09-27 DIAGNOSIS — M6281 Muscle weakness (generalized): Secondary | ICD-10-CM | POA: Diagnosis not present

## 2021-09-27 DIAGNOSIS — R2689 Other abnormalities of gait and mobility: Secondary | ICD-10-CM | POA: Diagnosis not present

## 2021-09-27 DIAGNOSIS — G9341 Metabolic encephalopathy: Secondary | ICD-10-CM | POA: Diagnosis not present

## 2021-09-27 DIAGNOSIS — R1312 Dysphagia, oropharyngeal phase: Secondary | ICD-10-CM | POA: Diagnosis not present

## 2021-09-28 DIAGNOSIS — R2689 Other abnormalities of gait and mobility: Secondary | ICD-10-CM | POA: Diagnosis not present

## 2021-09-28 DIAGNOSIS — R1312 Dysphagia, oropharyngeal phase: Secondary | ICD-10-CM | POA: Diagnosis not present

## 2021-09-28 DIAGNOSIS — M6281 Muscle weakness (generalized): Secondary | ICD-10-CM | POA: Diagnosis not present

## 2021-09-28 DIAGNOSIS — G9341 Metabolic encephalopathy: Secondary | ICD-10-CM | POA: Diagnosis not present

## 2021-10-01 DIAGNOSIS — R1312 Dysphagia, oropharyngeal phase: Secondary | ICD-10-CM | POA: Diagnosis not present

## 2021-10-01 DIAGNOSIS — R2689 Other abnormalities of gait and mobility: Secondary | ICD-10-CM | POA: Diagnosis not present

## 2021-10-01 DIAGNOSIS — G9341 Metabolic encephalopathy: Secondary | ICD-10-CM | POA: Diagnosis not present

## 2021-10-01 DIAGNOSIS — M6281 Muscle weakness (generalized): Secondary | ICD-10-CM | POA: Diagnosis not present

## 2021-10-02 DIAGNOSIS — G9341 Metabolic encephalopathy: Secondary | ICD-10-CM | POA: Diagnosis not present

## 2021-10-02 DIAGNOSIS — R2689 Other abnormalities of gait and mobility: Secondary | ICD-10-CM | POA: Diagnosis not present

## 2021-10-02 DIAGNOSIS — M6281 Muscle weakness (generalized): Secondary | ICD-10-CM | POA: Diagnosis not present

## 2021-10-02 DIAGNOSIS — I7091 Generalized atherosclerosis: Secondary | ICD-10-CM | POA: Diagnosis not present

## 2021-10-02 DIAGNOSIS — R600 Localized edema: Secondary | ICD-10-CM | POA: Diagnosis not present

## 2021-10-02 DIAGNOSIS — R1312 Dysphagia, oropharyngeal phase: Secondary | ICD-10-CM | POA: Diagnosis not present

## 2021-10-02 DIAGNOSIS — L603 Nail dystrophy: Secondary | ICD-10-CM | POA: Diagnosis not present

## 2021-10-03 DIAGNOSIS — R1312 Dysphagia, oropharyngeal phase: Secondary | ICD-10-CM | POA: Diagnosis not present

## 2021-10-03 DIAGNOSIS — M6281 Muscle weakness (generalized): Secondary | ICD-10-CM | POA: Diagnosis not present

## 2021-10-03 DIAGNOSIS — G9341 Metabolic encephalopathy: Secondary | ICD-10-CM | POA: Diagnosis not present

## 2021-10-03 DIAGNOSIS — R2689 Other abnormalities of gait and mobility: Secondary | ICD-10-CM | POA: Diagnosis not present

## 2021-10-04 DIAGNOSIS — G9341 Metabolic encephalopathy: Secondary | ICD-10-CM | POA: Diagnosis not present

## 2021-10-04 DIAGNOSIS — M6281 Muscle weakness (generalized): Secondary | ICD-10-CM | POA: Diagnosis not present

## 2021-10-04 DIAGNOSIS — R2689 Other abnormalities of gait and mobility: Secondary | ICD-10-CM | POA: Diagnosis not present

## 2021-10-04 DIAGNOSIS — R1312 Dysphagia, oropharyngeal phase: Secondary | ICD-10-CM | POA: Diagnosis not present

## 2021-10-05 DIAGNOSIS — G9341 Metabolic encephalopathy: Secondary | ICD-10-CM | POA: Diagnosis not present

## 2021-10-05 DIAGNOSIS — R2689 Other abnormalities of gait and mobility: Secondary | ICD-10-CM | POA: Diagnosis not present

## 2021-10-05 DIAGNOSIS — M6281 Muscle weakness (generalized): Secondary | ICD-10-CM | POA: Diagnosis not present

## 2021-10-05 DIAGNOSIS — R1312 Dysphagia, oropharyngeal phase: Secondary | ICD-10-CM | POA: Diagnosis not present

## 2021-10-08 DIAGNOSIS — R2689 Other abnormalities of gait and mobility: Secondary | ICD-10-CM | POA: Diagnosis not present

## 2021-10-08 DIAGNOSIS — G9341 Metabolic encephalopathy: Secondary | ICD-10-CM | POA: Diagnosis not present

## 2021-10-08 DIAGNOSIS — R1312 Dysphagia, oropharyngeal phase: Secondary | ICD-10-CM | POA: Diagnosis not present

## 2021-10-08 DIAGNOSIS — M6281 Muscle weakness (generalized): Secondary | ICD-10-CM | POA: Diagnosis not present

## 2021-10-09 DIAGNOSIS — G9341 Metabolic encephalopathy: Secondary | ICD-10-CM | POA: Diagnosis not present

## 2021-10-09 DIAGNOSIS — M6281 Muscle weakness (generalized): Secondary | ICD-10-CM | POA: Diagnosis not present

## 2021-10-09 DIAGNOSIS — R1312 Dysphagia, oropharyngeal phase: Secondary | ICD-10-CM | POA: Diagnosis not present

## 2021-10-09 DIAGNOSIS — R2689 Other abnormalities of gait and mobility: Secondary | ICD-10-CM | POA: Diagnosis not present

## 2021-10-10 DIAGNOSIS — R2689 Other abnormalities of gait and mobility: Secondary | ICD-10-CM | POA: Diagnosis not present

## 2021-10-10 DIAGNOSIS — R1312 Dysphagia, oropharyngeal phase: Secondary | ICD-10-CM | POA: Diagnosis not present

## 2021-10-10 DIAGNOSIS — M6281 Muscle weakness (generalized): Secondary | ICD-10-CM | POA: Diagnosis not present

## 2021-10-10 DIAGNOSIS — G9341 Metabolic encephalopathy: Secondary | ICD-10-CM | POA: Diagnosis not present

## 2021-10-11 DIAGNOSIS — Z20822 Contact with and (suspected) exposure to covid-19: Secondary | ICD-10-CM | POA: Diagnosis not present

## 2021-10-12 DIAGNOSIS — R1312 Dysphagia, oropharyngeal phase: Secondary | ICD-10-CM | POA: Diagnosis not present

## 2021-10-12 DIAGNOSIS — R2689 Other abnormalities of gait and mobility: Secondary | ICD-10-CM | POA: Diagnosis not present

## 2021-10-12 DIAGNOSIS — M6281 Muscle weakness (generalized): Secondary | ICD-10-CM | POA: Diagnosis not present

## 2021-10-12 DIAGNOSIS — G9341 Metabolic encephalopathy: Secondary | ICD-10-CM | POA: Diagnosis not present

## 2021-10-13 DIAGNOSIS — R2689 Other abnormalities of gait and mobility: Secondary | ICD-10-CM | POA: Diagnosis not present

## 2021-10-13 DIAGNOSIS — G9341 Metabolic encephalopathy: Secondary | ICD-10-CM | POA: Diagnosis not present

## 2021-10-13 DIAGNOSIS — R1312 Dysphagia, oropharyngeal phase: Secondary | ICD-10-CM | POA: Diagnosis not present

## 2021-10-13 DIAGNOSIS — M6281 Muscle weakness (generalized): Secondary | ICD-10-CM | POA: Diagnosis not present

## 2021-10-15 DIAGNOSIS — R2689 Other abnormalities of gait and mobility: Secondary | ICD-10-CM | POA: Diagnosis not present

## 2021-10-15 DIAGNOSIS — R1312 Dysphagia, oropharyngeal phase: Secondary | ICD-10-CM | POA: Diagnosis not present

## 2021-10-15 DIAGNOSIS — G9341 Metabolic encephalopathy: Secondary | ICD-10-CM | POA: Diagnosis not present

## 2021-10-15 DIAGNOSIS — M6281 Muscle weakness (generalized): Secondary | ICD-10-CM | POA: Diagnosis not present

## 2021-10-16 DIAGNOSIS — E539 Vitamin B deficiency, unspecified: Secondary | ICD-10-CM | POA: Diagnosis not present

## 2021-10-16 DIAGNOSIS — G9341 Metabolic encephalopathy: Secondary | ICD-10-CM | POA: Diagnosis not present

## 2021-10-16 DIAGNOSIS — R1312 Dysphagia, oropharyngeal phase: Secondary | ICD-10-CM | POA: Diagnosis not present

## 2021-10-16 DIAGNOSIS — J309 Allergic rhinitis, unspecified: Secondary | ICD-10-CM | POA: Diagnosis not present

## 2021-10-16 DIAGNOSIS — R2689 Other abnormalities of gait and mobility: Secondary | ICD-10-CM | POA: Diagnosis not present

## 2021-10-16 DIAGNOSIS — M6281 Muscle weakness (generalized): Secondary | ICD-10-CM | POA: Diagnosis not present

## 2021-10-16 DIAGNOSIS — F32A Depression, unspecified: Secondary | ICD-10-CM | POA: Diagnosis not present

## 2021-10-17 DIAGNOSIS — R2689 Other abnormalities of gait and mobility: Secondary | ICD-10-CM | POA: Diagnosis not present

## 2021-10-17 DIAGNOSIS — G9341 Metabolic encephalopathy: Secondary | ICD-10-CM | POA: Diagnosis not present

## 2021-10-17 DIAGNOSIS — R1312 Dysphagia, oropharyngeal phase: Secondary | ICD-10-CM | POA: Diagnosis not present

## 2021-10-17 DIAGNOSIS — M6281 Muscle weakness (generalized): Secondary | ICD-10-CM | POA: Diagnosis not present

## 2021-10-19 DIAGNOSIS — R2689 Other abnormalities of gait and mobility: Secondary | ICD-10-CM | POA: Diagnosis not present

## 2021-10-19 DIAGNOSIS — M6281 Muscle weakness (generalized): Secondary | ICD-10-CM | POA: Diagnosis not present

## 2021-10-19 DIAGNOSIS — G9341 Metabolic encephalopathy: Secondary | ICD-10-CM | POA: Diagnosis not present

## 2021-10-19 DIAGNOSIS — R1312 Dysphagia, oropharyngeal phase: Secondary | ICD-10-CM | POA: Diagnosis not present

## 2021-10-20 DIAGNOSIS — R2689 Other abnormalities of gait and mobility: Secondary | ICD-10-CM | POA: Diagnosis not present

## 2021-10-20 DIAGNOSIS — M6281 Muscle weakness (generalized): Secondary | ICD-10-CM | POA: Diagnosis not present

## 2021-10-20 DIAGNOSIS — G9341 Metabolic encephalopathy: Secondary | ICD-10-CM | POA: Diagnosis not present

## 2021-10-22 DIAGNOSIS — G9341 Metabolic encephalopathy: Secondary | ICD-10-CM | POA: Diagnosis not present

## 2021-10-22 DIAGNOSIS — R2689 Other abnormalities of gait and mobility: Secondary | ICD-10-CM | POA: Diagnosis not present

## 2021-10-22 DIAGNOSIS — M6281 Muscle weakness (generalized): Secondary | ICD-10-CM | POA: Diagnosis not present

## 2021-10-24 DIAGNOSIS — R2689 Other abnormalities of gait and mobility: Secondary | ICD-10-CM | POA: Diagnosis not present

## 2021-10-24 DIAGNOSIS — M6281 Muscle weakness (generalized): Secondary | ICD-10-CM | POA: Diagnosis not present

## 2021-10-24 DIAGNOSIS — G9341 Metabolic encephalopathy: Secondary | ICD-10-CM | POA: Diagnosis not present

## 2021-10-25 DIAGNOSIS — R2689 Other abnormalities of gait and mobility: Secondary | ICD-10-CM | POA: Diagnosis not present

## 2021-10-25 DIAGNOSIS — G9341 Metabolic encephalopathy: Secondary | ICD-10-CM | POA: Diagnosis not present

## 2021-10-25 DIAGNOSIS — M6281 Muscle weakness (generalized): Secondary | ICD-10-CM | POA: Diagnosis not present

## 2021-10-29 DIAGNOSIS — G4709 Other insomnia: Secondary | ICD-10-CM | POA: Diagnosis not present

## 2021-10-29 DIAGNOSIS — F03C11 Unspecified dementia, severe, with agitation: Secondary | ICD-10-CM | POA: Diagnosis not present

## 2021-10-29 DIAGNOSIS — F411 Generalized anxiety disorder: Secondary | ICD-10-CM | POA: Diagnosis not present

## 2021-10-29 DIAGNOSIS — F332 Major depressive disorder, recurrent severe without psychotic features: Secondary | ICD-10-CM | POA: Diagnosis not present

## 2021-11-09 DIAGNOSIS — Z20822 Contact with and (suspected) exposure to covid-19: Secondary | ICD-10-CM | POA: Diagnosis not present

## 2021-12-11 DIAGNOSIS — F03C11 Unspecified dementia, severe, with agitation: Secondary | ICD-10-CM | POA: Diagnosis not present

## 2021-12-11 DIAGNOSIS — G4709 Other insomnia: Secondary | ICD-10-CM | POA: Diagnosis not present

## 2021-12-11 DIAGNOSIS — F332 Major depressive disorder, recurrent severe without psychotic features: Secondary | ICD-10-CM | POA: Diagnosis not present

## 2021-12-11 DIAGNOSIS — F411 Generalized anxiety disorder: Secondary | ICD-10-CM | POA: Diagnosis not present

## 2021-12-20 DIAGNOSIS — I7091 Generalized atherosclerosis: Secondary | ICD-10-CM | POA: Diagnosis not present

## 2021-12-20 DIAGNOSIS — B351 Tinea unguium: Secondary | ICD-10-CM | POA: Diagnosis not present

## 2022-01-01 DIAGNOSIS — F32A Depression, unspecified: Secondary | ICD-10-CM | POA: Diagnosis not present

## 2022-01-01 DIAGNOSIS — I10 Essential (primary) hypertension: Secondary | ICD-10-CM | POA: Diagnosis not present

## 2022-01-01 DIAGNOSIS — G9341 Metabolic encephalopathy: Secondary | ICD-10-CM | POA: Diagnosis not present

## 2022-01-28 DIAGNOSIS — F411 Generalized anxiety disorder: Secondary | ICD-10-CM | POA: Diagnosis not present

## 2022-01-28 DIAGNOSIS — F332 Major depressive disorder, recurrent severe without psychotic features: Secondary | ICD-10-CM | POA: Diagnosis not present

## 2022-01-28 DIAGNOSIS — F03C11 Unspecified dementia, severe, with agitation: Secondary | ICD-10-CM | POA: Diagnosis not present

## 2022-01-28 DIAGNOSIS — G4709 Other insomnia: Secondary | ICD-10-CM | POA: Diagnosis not present

## 2022-02-05 DIAGNOSIS — N390 Urinary tract infection, site not specified: Secondary | ICD-10-CM | POA: Diagnosis not present

## 2022-02-07 DIAGNOSIS — N390 Urinary tract infection, site not specified: Secondary | ICD-10-CM | POA: Diagnosis not present

## 2022-02-19 DIAGNOSIS — M6281 Muscle weakness (generalized): Secondary | ICD-10-CM | POA: Diagnosis not present

## 2022-02-19 DIAGNOSIS — R262 Difficulty in walking, not elsewhere classified: Secondary | ICD-10-CM | POA: Diagnosis not present

## 2022-02-20 DIAGNOSIS — M6281 Muscle weakness (generalized): Secondary | ICD-10-CM | POA: Diagnosis not present

## 2022-02-20 DIAGNOSIS — R262 Difficulty in walking, not elsewhere classified: Secondary | ICD-10-CM | POA: Diagnosis not present

## 2022-02-21 DIAGNOSIS — M6281 Muscle weakness (generalized): Secondary | ICD-10-CM | POA: Diagnosis not present

## 2022-02-21 DIAGNOSIS — R262 Difficulty in walking, not elsewhere classified: Secondary | ICD-10-CM | POA: Diagnosis not present

## 2022-02-22 DIAGNOSIS — I10 Essential (primary) hypertension: Secondary | ICD-10-CM | POA: Diagnosis not present

## 2022-02-22 DIAGNOSIS — R262 Difficulty in walking, not elsewhere classified: Secondary | ICD-10-CM | POA: Diagnosis not present

## 2022-02-22 DIAGNOSIS — G309 Alzheimer's disease, unspecified: Secondary | ICD-10-CM | POA: Diagnosis not present

## 2022-02-22 DIAGNOSIS — E785 Hyperlipidemia, unspecified: Secondary | ICD-10-CM | POA: Diagnosis not present

## 2022-02-22 DIAGNOSIS — F0280 Dementia in other diseases classified elsewhere without behavioral disturbance: Secondary | ICD-10-CM | POA: Diagnosis not present

## 2022-02-22 DIAGNOSIS — U071 COVID-19: Secondary | ICD-10-CM | POA: Diagnosis not present

## 2022-02-22 DIAGNOSIS — M6281 Muscle weakness (generalized): Secondary | ICD-10-CM | POA: Diagnosis not present

## 2022-02-25 DIAGNOSIS — M6281 Muscle weakness (generalized): Secondary | ICD-10-CM | POA: Diagnosis not present

## 2022-02-25 DIAGNOSIS — G309 Alzheimer's disease, unspecified: Secondary | ICD-10-CM | POA: Diagnosis not present

## 2022-02-25 DIAGNOSIS — R7989 Other specified abnormal findings of blood chemistry: Secondary | ICD-10-CM | POA: Diagnosis not present

## 2022-02-25 DIAGNOSIS — F0280 Dementia in other diseases classified elsewhere without behavioral disturbance: Secondary | ICD-10-CM | POA: Diagnosis not present

## 2022-02-25 DIAGNOSIS — U071 COVID-19: Secondary | ICD-10-CM | POA: Diagnosis not present

## 2022-02-25 DIAGNOSIS — R262 Difficulty in walking, not elsewhere classified: Secondary | ICD-10-CM | POA: Diagnosis not present

## 2022-02-26 DIAGNOSIS — G4709 Other insomnia: Secondary | ICD-10-CM | POA: Diagnosis not present

## 2022-02-26 DIAGNOSIS — R262 Difficulty in walking, not elsewhere classified: Secondary | ICD-10-CM | POA: Diagnosis not present

## 2022-02-26 DIAGNOSIS — M6281 Muscle weakness (generalized): Secondary | ICD-10-CM | POA: Diagnosis not present

## 2022-02-26 DIAGNOSIS — F332 Major depressive disorder, recurrent severe without psychotic features: Secondary | ICD-10-CM | POA: Diagnosis not present

## 2022-02-26 DIAGNOSIS — F411 Generalized anxiety disorder: Secondary | ICD-10-CM | POA: Diagnosis not present

## 2022-02-26 DIAGNOSIS — F03C11 Unspecified dementia, severe, with agitation: Secondary | ICD-10-CM | POA: Diagnosis not present

## 2022-02-27 DIAGNOSIS — M6281 Muscle weakness (generalized): Secondary | ICD-10-CM | POA: Diagnosis not present

## 2022-02-27 DIAGNOSIS — R262 Difficulty in walking, not elsewhere classified: Secondary | ICD-10-CM | POA: Diagnosis not present

## 2022-02-28 DIAGNOSIS — M6281 Muscle weakness (generalized): Secondary | ICD-10-CM | POA: Diagnosis not present

## 2022-02-28 DIAGNOSIS — R262 Difficulty in walking, not elsewhere classified: Secondary | ICD-10-CM | POA: Diagnosis not present

## 2022-03-01 DIAGNOSIS — F0280 Dementia in other diseases classified elsewhere without behavioral disturbance: Secondary | ICD-10-CM | POA: Diagnosis not present

## 2022-03-01 DIAGNOSIS — U071 COVID-19: Secondary | ICD-10-CM | POA: Diagnosis not present

## 2022-03-01 DIAGNOSIS — G309 Alzheimer's disease, unspecified: Secondary | ICD-10-CM | POA: Diagnosis not present

## 2022-03-01 DIAGNOSIS — R7989 Other specified abnormal findings of blood chemistry: Secondary | ICD-10-CM | POA: Diagnosis not present

## 2022-03-03 DIAGNOSIS — R262 Difficulty in walking, not elsewhere classified: Secondary | ICD-10-CM | POA: Diagnosis not present

## 2022-03-03 DIAGNOSIS — M6281 Muscle weakness (generalized): Secondary | ICD-10-CM | POA: Diagnosis not present

## 2022-03-04 DIAGNOSIS — R7989 Other specified abnormal findings of blood chemistry: Secondary | ICD-10-CM | POA: Diagnosis not present

## 2022-03-04 DIAGNOSIS — G309 Alzheimer's disease, unspecified: Secondary | ICD-10-CM | POA: Diagnosis not present

## 2022-03-04 DIAGNOSIS — R262 Difficulty in walking, not elsewhere classified: Secondary | ICD-10-CM | POA: Diagnosis not present

## 2022-03-04 DIAGNOSIS — M6281 Muscle weakness (generalized): Secondary | ICD-10-CM | POA: Diagnosis not present

## 2022-03-04 DIAGNOSIS — F0280 Dementia in other diseases classified elsewhere without behavioral disturbance: Secondary | ICD-10-CM | POA: Diagnosis not present

## 2022-03-04 DIAGNOSIS — U071 COVID-19: Secondary | ICD-10-CM | POA: Diagnosis not present

## 2022-03-06 DIAGNOSIS — R262 Difficulty in walking, not elsewhere classified: Secondary | ICD-10-CM | POA: Diagnosis not present

## 2022-03-06 DIAGNOSIS — M6281 Muscle weakness (generalized): Secondary | ICD-10-CM | POA: Diagnosis not present

## 2022-03-07 DIAGNOSIS — R262 Difficulty in walking, not elsewhere classified: Secondary | ICD-10-CM | POA: Diagnosis not present

## 2022-03-07 DIAGNOSIS — M6281 Muscle weakness (generalized): Secondary | ICD-10-CM | POA: Diagnosis not present

## 2022-03-08 DIAGNOSIS — M6281 Muscle weakness (generalized): Secondary | ICD-10-CM | POA: Diagnosis not present

## 2022-03-08 DIAGNOSIS — R262 Difficulty in walking, not elsewhere classified: Secondary | ICD-10-CM | POA: Diagnosis not present

## 2022-03-09 DIAGNOSIS — R262 Difficulty in walking, not elsewhere classified: Secondary | ICD-10-CM | POA: Diagnosis not present

## 2022-03-09 DIAGNOSIS — M6281 Muscle weakness (generalized): Secondary | ICD-10-CM | POA: Diagnosis not present

## 2022-03-11 DIAGNOSIS — M6281 Muscle weakness (generalized): Secondary | ICD-10-CM | POA: Diagnosis not present

## 2022-03-11 DIAGNOSIS — R262 Difficulty in walking, not elsewhere classified: Secondary | ICD-10-CM | POA: Diagnosis not present

## 2022-03-12 DIAGNOSIS — M6281 Muscle weakness (generalized): Secondary | ICD-10-CM | POA: Diagnosis not present

## 2022-03-12 DIAGNOSIS — R262 Difficulty in walking, not elsewhere classified: Secondary | ICD-10-CM | POA: Diagnosis not present

## 2022-03-13 DIAGNOSIS — M6281 Muscle weakness (generalized): Secondary | ICD-10-CM | POA: Diagnosis not present

## 2022-03-13 DIAGNOSIS — R262 Difficulty in walking, not elsewhere classified: Secondary | ICD-10-CM | POA: Diagnosis not present

## 2022-03-14 DIAGNOSIS — M6281 Muscle weakness (generalized): Secondary | ICD-10-CM | POA: Diagnosis not present

## 2022-03-14 DIAGNOSIS — R262 Difficulty in walking, not elsewhere classified: Secondary | ICD-10-CM | POA: Diagnosis not present

## 2022-03-17 DIAGNOSIS — M6281 Muscle weakness (generalized): Secondary | ICD-10-CM | POA: Diagnosis not present

## 2022-03-17 DIAGNOSIS — R262 Difficulty in walking, not elsewhere classified: Secondary | ICD-10-CM | POA: Diagnosis not present

## 2022-03-18 DIAGNOSIS — M6281 Muscle weakness (generalized): Secondary | ICD-10-CM | POA: Diagnosis not present

## 2022-03-18 DIAGNOSIS — R262 Difficulty in walking, not elsewhere classified: Secondary | ICD-10-CM | POA: Diagnosis not present

## 2022-03-19 DIAGNOSIS — R262 Difficulty in walking, not elsewhere classified: Secondary | ICD-10-CM | POA: Diagnosis not present

## 2022-03-19 DIAGNOSIS — M6281 Muscle weakness (generalized): Secondary | ICD-10-CM | POA: Diagnosis not present

## 2022-03-20 DIAGNOSIS — R262 Difficulty in walking, not elsewhere classified: Secondary | ICD-10-CM | POA: Diagnosis not present

## 2022-03-20 DIAGNOSIS — M6281 Muscle weakness (generalized): Secondary | ICD-10-CM | POA: Diagnosis not present

## 2022-03-26 DIAGNOSIS — F332 Major depressive disorder, recurrent severe without psychotic features: Secondary | ICD-10-CM | POA: Diagnosis not present

## 2022-03-26 DIAGNOSIS — F03C11 Unspecified dementia, severe, with agitation: Secondary | ICD-10-CM | POA: Diagnosis not present

## 2022-03-26 DIAGNOSIS — F411 Generalized anxiety disorder: Secondary | ICD-10-CM | POA: Diagnosis not present

## 2022-03-26 DIAGNOSIS — G4709 Other insomnia: Secondary | ICD-10-CM | POA: Diagnosis not present

## 2022-03-27 DIAGNOSIS — F29 Unspecified psychosis not due to a substance or known physiological condition: Secondary | ICD-10-CM | POA: Diagnosis not present

## 2022-03-27 DIAGNOSIS — G309 Alzheimer's disease, unspecified: Secondary | ICD-10-CM | POA: Diagnosis not present

## 2022-03-27 DIAGNOSIS — F39 Unspecified mood [affective] disorder: Secondary | ICD-10-CM | POA: Diagnosis not present

## 2022-03-27 DIAGNOSIS — F0280 Dementia in other diseases classified elsewhere without behavioral disturbance: Secondary | ICD-10-CM | POA: Diagnosis not present

## 2022-03-27 DIAGNOSIS — Z5181 Encounter for therapeutic drug level monitoring: Secondary | ICD-10-CM | POA: Diagnosis not present

## 2022-03-27 DIAGNOSIS — F32A Depression, unspecified: Secondary | ICD-10-CM | POA: Diagnosis not present

## 2022-03-27 DIAGNOSIS — Z8616 Personal history of COVID-19: Secondary | ICD-10-CM | POA: Diagnosis not present

## 2022-03-27 DIAGNOSIS — E539 Vitamin B deficiency, unspecified: Secondary | ICD-10-CM | POA: Diagnosis not present

## 2022-03-27 DIAGNOSIS — I10 Essential (primary) hypertension: Secondary | ICD-10-CM | POA: Diagnosis not present

## 2022-03-28 DIAGNOSIS — B351 Tinea unguium: Secondary | ICD-10-CM | POA: Diagnosis not present

## 2022-03-28 DIAGNOSIS — I7091 Generalized atherosclerosis: Secondary | ICD-10-CM | POA: Diagnosis not present

## 2022-04-06 DIAGNOSIS — Z23 Encounter for immunization: Secondary | ICD-10-CM | POA: Diagnosis not present

## 2022-04-08 DIAGNOSIS — L89152 Pressure ulcer of sacral region, stage 2: Secondary | ICD-10-CM | POA: Diagnosis not present

## 2022-04-19 DIAGNOSIS — E46 Unspecified protein-calorie malnutrition: Secondary | ICD-10-CM | POA: Diagnosis not present

## 2022-04-19 DIAGNOSIS — R634 Abnormal weight loss: Secondary | ICD-10-CM | POA: Diagnosis not present

## 2022-04-19 DIAGNOSIS — G309 Alzheimer's disease, unspecified: Secondary | ICD-10-CM | POA: Diagnosis not present

## 2022-04-19 DIAGNOSIS — F0280 Dementia in other diseases classified elsewhere without behavioral disturbance: Secondary | ICD-10-CM | POA: Diagnosis not present

## 2022-04-24 DIAGNOSIS — G309 Alzheimer's disease, unspecified: Secondary | ICD-10-CM | POA: Diagnosis not present

## 2022-04-24 DIAGNOSIS — E46 Unspecified protein-calorie malnutrition: Secondary | ICD-10-CM | POA: Diagnosis not present

## 2022-04-24 DIAGNOSIS — F0280 Dementia in other diseases classified elsewhere without behavioral disturbance: Secondary | ICD-10-CM | POA: Diagnosis not present

## 2022-04-24 DIAGNOSIS — R630 Anorexia: Secondary | ICD-10-CM | POA: Diagnosis not present

## 2022-04-24 DIAGNOSIS — R634 Abnormal weight loss: Secondary | ICD-10-CM | POA: Diagnosis not present

## 2022-04-25 DIAGNOSIS — R1312 Dysphagia, oropharyngeal phase: Secondary | ICD-10-CM | POA: Diagnosis not present

## 2022-04-30 DIAGNOSIS — R1312 Dysphagia, oropharyngeal phase: Secondary | ICD-10-CM | POA: Diagnosis not present

## 2022-05-01 DIAGNOSIS — R1312 Dysphagia, oropharyngeal phase: Secondary | ICD-10-CM | POA: Diagnosis not present

## 2022-05-02 DIAGNOSIS — R1312 Dysphagia, oropharyngeal phase: Secondary | ICD-10-CM | POA: Diagnosis not present

## 2022-05-04 DIAGNOSIS — R1312 Dysphagia, oropharyngeal phase: Secondary | ICD-10-CM | POA: Diagnosis not present

## 2022-05-06 DIAGNOSIS — R1312 Dysphagia, oropharyngeal phase: Secondary | ICD-10-CM | POA: Diagnosis not present

## 2022-05-08 DIAGNOSIS — R400 Somnolence: Secondary | ICD-10-CM | POA: Diagnosis not present

## 2022-05-08 DIAGNOSIS — F0280 Dementia in other diseases classified elsewhere without behavioral disturbance: Secondary | ICD-10-CM | POA: Diagnosis not present

## 2022-05-08 DIAGNOSIS — G309 Alzheimer's disease, unspecified: Secondary | ICD-10-CM | POA: Diagnosis not present

## 2022-05-09 DIAGNOSIS — Z23 Encounter for immunization: Secondary | ICD-10-CM | POA: Diagnosis not present

## 2022-05-09 DIAGNOSIS — R1312 Dysphagia, oropharyngeal phase: Secondary | ICD-10-CM | POA: Diagnosis not present

## 2022-05-09 DIAGNOSIS — I10 Essential (primary) hypertension: Secondary | ICD-10-CM | POA: Diagnosis not present

## 2022-05-09 DIAGNOSIS — N390 Urinary tract infection, site not specified: Secondary | ICD-10-CM | POA: Diagnosis not present

## 2022-05-10 DIAGNOSIS — F02C2 Dementia in other diseases classified elsewhere, severe, with psychotic disturbance: Secondary | ICD-10-CM | POA: Diagnosis not present

## 2022-05-10 DIAGNOSIS — G309 Alzheimer's disease, unspecified: Secondary | ICD-10-CM | POA: Diagnosis not present

## 2022-05-10 DIAGNOSIS — F0280 Dementia in other diseases classified elsewhere without behavioral disturbance: Secondary | ICD-10-CM | POA: Diagnosis not present

## 2022-05-10 DIAGNOSIS — R4182 Altered mental status, unspecified: Secondary | ICD-10-CM | POA: Diagnosis not present

## 2022-05-10 DIAGNOSIS — F411 Generalized anxiety disorder: Secondary | ICD-10-CM | POA: Diagnosis not present

## 2022-05-10 DIAGNOSIS — F332 Major depressive disorder, recurrent severe without psychotic features: Secondary | ICD-10-CM | POA: Diagnosis not present

## 2022-05-10 DIAGNOSIS — G4709 Other insomnia: Secondary | ICD-10-CM | POA: Diagnosis not present

## 2022-05-11 DIAGNOSIS — B9620 Unspecified Escherichia coli [E. coli] as the cause of diseases classified elsewhere: Secondary | ICD-10-CM | POA: Diagnosis not present

## 2022-05-11 DIAGNOSIS — F0280 Dementia in other diseases classified elsewhere without behavioral disturbance: Secondary | ICD-10-CM | POA: Diagnosis not present

## 2022-05-11 DIAGNOSIS — N390 Urinary tract infection, site not specified: Secondary | ICD-10-CM | POA: Diagnosis not present

## 2022-05-11 DIAGNOSIS — R4182 Altered mental status, unspecified: Secondary | ICD-10-CM | POA: Diagnosis not present

## 2022-05-11 DIAGNOSIS — G309 Alzheimer's disease, unspecified: Secondary | ICD-10-CM | POA: Diagnosis not present

## 2022-05-12 DIAGNOSIS — G309 Alzheimer's disease, unspecified: Secondary | ICD-10-CM | POA: Diagnosis not present

## 2022-05-12 DIAGNOSIS — E46 Unspecified protein-calorie malnutrition: Secondary | ICD-10-CM | POA: Diagnosis not present

## 2022-05-12 DIAGNOSIS — R634 Abnormal weight loss: Secondary | ICD-10-CM | POA: Diagnosis not present

## 2022-05-12 DIAGNOSIS — F0150 Vascular dementia without behavioral disturbance: Secondary | ICD-10-CM | POA: Diagnosis not present

## 2022-05-12 DIAGNOSIS — R630 Anorexia: Secondary | ICD-10-CM | POA: Diagnosis not present

## 2022-05-13 DIAGNOSIS — R634 Abnormal weight loss: Secondary | ICD-10-CM | POA: Diagnosis not present

## 2022-05-13 DIAGNOSIS — E46 Unspecified protein-calorie malnutrition: Secondary | ICD-10-CM | POA: Diagnosis not present

## 2022-05-13 DIAGNOSIS — N390 Urinary tract infection, site not specified: Secondary | ICD-10-CM | POA: Diagnosis not present

## 2022-05-13 DIAGNOSIS — B9620 Unspecified Escherichia coli [E. coli] as the cause of diseases classified elsewhere: Secondary | ICD-10-CM | POA: Diagnosis not present

## 2022-05-13 DIAGNOSIS — G309 Alzheimer's disease, unspecified: Secondary | ICD-10-CM | POA: Diagnosis not present

## 2022-05-13 DIAGNOSIS — F0150 Vascular dementia without behavioral disturbance: Secondary | ICD-10-CM | POA: Diagnosis not present

## 2022-05-13 DIAGNOSIS — R4182 Altered mental status, unspecified: Secondary | ICD-10-CM | POA: Diagnosis not present

## 2022-05-13 DIAGNOSIS — R630 Anorexia: Secondary | ICD-10-CM | POA: Diagnosis not present

## 2022-05-14 DIAGNOSIS — R1312 Dysphagia, oropharyngeal phase: Secondary | ICD-10-CM | POA: Diagnosis not present

## 2022-05-14 DIAGNOSIS — D649 Anemia, unspecified: Secondary | ICD-10-CM | POA: Diagnosis not present

## 2022-05-15 DIAGNOSIS — R4182 Altered mental status, unspecified: Secondary | ICD-10-CM | POA: Diagnosis not present

## 2022-05-15 DIAGNOSIS — F0150 Vascular dementia without behavioral disturbance: Secondary | ICD-10-CM | POA: Diagnosis not present

## 2022-05-15 DIAGNOSIS — R630 Anorexia: Secondary | ICD-10-CM | POA: Diagnosis not present

## 2022-05-15 DIAGNOSIS — R1312 Dysphagia, oropharyngeal phase: Secondary | ICD-10-CM | POA: Diagnosis not present

## 2022-05-15 DIAGNOSIS — E46 Unspecified protein-calorie malnutrition: Secondary | ICD-10-CM | POA: Diagnosis not present

## 2022-05-15 DIAGNOSIS — Z7189 Other specified counseling: Secondary | ICD-10-CM | POA: Diagnosis not present

## 2022-05-15 DIAGNOSIS — N390 Urinary tract infection, site not specified: Secondary | ICD-10-CM | POA: Diagnosis not present

## 2022-05-15 DIAGNOSIS — G309 Alzheimer's disease, unspecified: Secondary | ICD-10-CM | POA: Diagnosis not present

## 2022-05-15 DIAGNOSIS — R634 Abnormal weight loss: Secondary | ICD-10-CM | POA: Diagnosis not present

## 2022-05-15 DIAGNOSIS — B9620 Unspecified Escherichia coli [E. coli] as the cause of diseases classified elsewhere: Secondary | ICD-10-CM | POA: Diagnosis not present

## 2022-05-16 DIAGNOSIS — R630 Anorexia: Secondary | ICD-10-CM | POA: Diagnosis not present

## 2022-05-16 DIAGNOSIS — E46 Unspecified protein-calorie malnutrition: Secondary | ICD-10-CM | POA: Diagnosis not present

## 2022-05-16 DIAGNOSIS — H25813 Combined forms of age-related cataract, bilateral: Secondary | ICD-10-CM | POA: Diagnosis not present

## 2022-05-16 DIAGNOSIS — B9620 Unspecified Escherichia coli [E. coli] as the cause of diseases classified elsewhere: Secondary | ICD-10-CM | POA: Diagnosis not present

## 2022-05-16 DIAGNOSIS — F0150 Vascular dementia without behavioral disturbance: Secondary | ICD-10-CM | POA: Diagnosis not present

## 2022-05-16 DIAGNOSIS — R4182 Altered mental status, unspecified: Secondary | ICD-10-CM | POA: Diagnosis not present

## 2022-05-16 DIAGNOSIS — G309 Alzheimer's disease, unspecified: Secondary | ICD-10-CM | POA: Diagnosis not present

## 2022-05-16 DIAGNOSIS — N390 Urinary tract infection, site not specified: Secondary | ICD-10-CM | POA: Diagnosis not present

## 2022-05-16 DIAGNOSIS — Z7189 Other specified counseling: Secondary | ICD-10-CM | POA: Diagnosis not present

## 2022-05-16 DIAGNOSIS — H524 Presbyopia: Secondary | ICD-10-CM | POA: Diagnosis not present

## 2022-05-16 DIAGNOSIS — R634 Abnormal weight loss: Secondary | ICD-10-CM | POA: Diagnosis not present

## 2022-05-17 DIAGNOSIS — E46 Unspecified protein-calorie malnutrition: Secondary | ICD-10-CM | POA: Diagnosis not present

## 2022-05-17 DIAGNOSIS — N390 Urinary tract infection, site not specified: Secondary | ICD-10-CM | POA: Diagnosis not present

## 2022-05-17 DIAGNOSIS — B9620 Unspecified Escherichia coli [E. coli] as the cause of diseases classified elsewhere: Secondary | ICD-10-CM | POA: Diagnosis not present

## 2022-05-17 DIAGNOSIS — R630 Anorexia: Secondary | ICD-10-CM | POA: Diagnosis not present

## 2022-05-17 DIAGNOSIS — F0150 Vascular dementia without behavioral disturbance: Secondary | ICD-10-CM | POA: Diagnosis not present

## 2022-05-20 DIAGNOSIS — R1312 Dysphagia, oropharyngeal phase: Secondary | ICD-10-CM | POA: Diagnosis not present

## 2022-05-21 DIAGNOSIS — N183 Chronic kidney disease, stage 3 unspecified: Secondary | ICD-10-CM | POA: Diagnosis not present

## 2022-05-21 DIAGNOSIS — F0150 Vascular dementia without behavioral disturbance: Secondary | ICD-10-CM | POA: Diagnosis not present

## 2022-05-21 DIAGNOSIS — F329 Major depressive disorder, single episode, unspecified: Secondary | ICD-10-CM | POA: Diagnosis not present

## 2022-05-21 DIAGNOSIS — F0280 Dementia in other diseases classified elsewhere without behavioral disturbance: Secondary | ICD-10-CM | POA: Diagnosis not present

## 2022-05-21 DIAGNOSIS — J439 Emphysema, unspecified: Secondary | ICD-10-CM | POA: Diagnosis not present

## 2022-05-21 DIAGNOSIS — G309 Alzheimer's disease, unspecified: Secondary | ICD-10-CM | POA: Diagnosis not present

## 2022-05-21 DIAGNOSIS — E46 Unspecified protein-calorie malnutrition: Secondary | ICD-10-CM | POA: Diagnosis not present

## 2022-05-21 DIAGNOSIS — I700 Atherosclerosis of aorta: Secondary | ICD-10-CM | POA: Diagnosis not present

## 2022-05-22 DIAGNOSIS — R1312 Dysphagia, oropharyngeal phase: Secondary | ICD-10-CM | POA: Diagnosis not present

## 2022-05-23 DIAGNOSIS — R1312 Dysphagia, oropharyngeal phase: Secondary | ICD-10-CM | POA: Diagnosis not present

## 2022-05-24 DIAGNOSIS — E46 Unspecified protein-calorie malnutrition: Secondary | ICD-10-CM | POA: Diagnosis not present

## 2022-05-24 DIAGNOSIS — F0150 Vascular dementia without behavioral disturbance: Secondary | ICD-10-CM | POA: Diagnosis not present

## 2022-05-24 DIAGNOSIS — N390 Urinary tract infection, site not specified: Secondary | ICD-10-CM | POA: Diagnosis not present

## 2022-05-24 DIAGNOSIS — B9620 Unspecified Escherichia coli [E. coli] as the cause of diseases classified elsewhere: Secondary | ICD-10-CM | POA: Diagnosis not present

## 2022-05-24 DIAGNOSIS — R634 Abnormal weight loss: Secondary | ICD-10-CM | POA: Diagnosis not present

## 2022-05-27 DIAGNOSIS — R1312 Dysphagia, oropharyngeal phase: Secondary | ICD-10-CM | POA: Diagnosis not present

## 2022-05-27 DIAGNOSIS — L539 Erythematous condition, unspecified: Secondary | ICD-10-CM | POA: Diagnosis not present

## 2022-05-29 DIAGNOSIS — F0150 Vascular dementia without behavioral disturbance: Secondary | ICD-10-CM | POA: Diagnosis not present

## 2022-05-29 DIAGNOSIS — G309 Alzheimer's disease, unspecified: Secondary | ICD-10-CM | POA: Diagnosis not present

## 2022-05-29 DIAGNOSIS — R634 Abnormal weight loss: Secondary | ICD-10-CM | POA: Diagnosis not present

## 2022-05-29 DIAGNOSIS — R630 Anorexia: Secondary | ICD-10-CM | POA: Diagnosis not present

## 2022-06-03 DIAGNOSIS — R1312 Dysphagia, oropharyngeal phase: Secondary | ICD-10-CM | POA: Diagnosis not present

## 2022-06-04 DIAGNOSIS — R1312 Dysphagia, oropharyngeal phase: Secondary | ICD-10-CM | POA: Diagnosis not present

## 2022-06-05 DIAGNOSIS — R1312 Dysphagia, oropharyngeal phase: Secondary | ICD-10-CM | POA: Diagnosis not present

## 2022-06-06 DIAGNOSIS — R1312 Dysphagia, oropharyngeal phase: Secondary | ICD-10-CM | POA: Diagnosis not present

## 2022-06-07 DIAGNOSIS — R1312 Dysphagia, oropharyngeal phase: Secondary | ICD-10-CM | POA: Diagnosis not present

## 2022-06-10 DIAGNOSIS — R1312 Dysphagia, oropharyngeal phase: Secondary | ICD-10-CM | POA: Diagnosis not present

## 2022-06-17 DIAGNOSIS — G4709 Other insomnia: Secondary | ICD-10-CM | POA: Diagnosis not present

## 2022-06-17 DIAGNOSIS — R1312 Dysphagia, oropharyngeal phase: Secondary | ICD-10-CM | POA: Diagnosis not present

## 2022-06-17 DIAGNOSIS — F411 Generalized anxiety disorder: Secondary | ICD-10-CM | POA: Diagnosis not present

## 2022-06-17 DIAGNOSIS — F02C2 Dementia in other diseases classified elsewhere, severe, with psychotic disturbance: Secondary | ICD-10-CM | POA: Diagnosis not present

## 2022-06-17 DIAGNOSIS — F332 Major depressive disorder, recurrent severe without psychotic features: Secondary | ICD-10-CM | POA: Diagnosis not present

## 2022-06-18 DIAGNOSIS — R1312 Dysphagia, oropharyngeal phase: Secondary | ICD-10-CM | POA: Diagnosis not present

## 2022-06-19 DIAGNOSIS — R634 Abnormal weight loss: Secondary | ICD-10-CM | POA: Diagnosis not present

## 2022-06-19 DIAGNOSIS — F0150 Vascular dementia without behavioral disturbance: Secondary | ICD-10-CM | POA: Diagnosis not present

## 2022-06-19 DIAGNOSIS — G309 Alzheimer's disease, unspecified: Secondary | ICD-10-CM | POA: Diagnosis not present

## 2022-06-19 DIAGNOSIS — R1312 Dysphagia, oropharyngeal phase: Secondary | ICD-10-CM | POA: Diagnosis not present

## 2022-06-19 DIAGNOSIS — R630 Anorexia: Secondary | ICD-10-CM | POA: Diagnosis not present

## 2022-06-19 DIAGNOSIS — R627 Adult failure to thrive: Secondary | ICD-10-CM | POA: Diagnosis not present

## 2022-06-20 DIAGNOSIS — R1312 Dysphagia, oropharyngeal phase: Secondary | ICD-10-CM | POA: Diagnosis not present

## 2022-06-24 DIAGNOSIS — R1312 Dysphagia, oropharyngeal phase: Secondary | ICD-10-CM | POA: Diagnosis not present

## 2022-06-26 DIAGNOSIS — R1312 Dysphagia, oropharyngeal phase: Secondary | ICD-10-CM | POA: Diagnosis not present

## 2022-07-01 DIAGNOSIS — Z5320 Procedure and treatment not carried out because of patient's decision for unspecified reasons: Secondary | ICD-10-CM | POA: Diagnosis not present

## 2022-07-01 DIAGNOSIS — Z91148 Patient's other noncompliance with medication regimen for other reason: Secondary | ICD-10-CM | POA: Diagnosis not present

## 2022-07-01 DIAGNOSIS — R419 Unspecified symptoms and signs involving cognitive functions and awareness: Secondary | ICD-10-CM | POA: Diagnosis not present

## 2022-07-01 DIAGNOSIS — R627 Adult failure to thrive: Secondary | ICD-10-CM | POA: Diagnosis not present

## 2022-07-01 DIAGNOSIS — R1312 Dysphagia, oropharyngeal phase: Secondary | ICD-10-CM | POA: Diagnosis not present

## 2022-07-02 DIAGNOSIS — R1312 Dysphagia, oropharyngeal phase: Secondary | ICD-10-CM | POA: Diagnosis not present

## 2022-07-05 DIAGNOSIS — R1312 Dysphagia, oropharyngeal phase: Secondary | ICD-10-CM | POA: Diagnosis not present

## 2022-07-07 DIAGNOSIS — R1312 Dysphagia, oropharyngeal phase: Secondary | ICD-10-CM | POA: Diagnosis not present

## 2022-07-08 DIAGNOSIS — S81801A Unspecified open wound, right lower leg, initial encounter: Secondary | ICD-10-CM | POA: Diagnosis not present

## 2022-07-08 DIAGNOSIS — S81802A Unspecified open wound, left lower leg, initial encounter: Secondary | ICD-10-CM | POA: Diagnosis not present

## 2022-07-09 DIAGNOSIS — B351 Tinea unguium: Secondary | ICD-10-CM | POA: Diagnosis not present

## 2022-07-09 DIAGNOSIS — I7091 Generalized atherosclerosis: Secondary | ICD-10-CM | POA: Diagnosis not present

## 2022-07-17 DIAGNOSIS — R630 Anorexia: Secondary | ICD-10-CM | POA: Diagnosis not present

## 2022-07-17 DIAGNOSIS — R1312 Dysphagia, oropharyngeal phase: Secondary | ICD-10-CM | POA: Diagnosis not present

## 2022-07-17 DIAGNOSIS — R634 Abnormal weight loss: Secondary | ICD-10-CM | POA: Diagnosis not present

## 2022-07-17 DIAGNOSIS — I10 Essential (primary) hypertension: Secondary | ICD-10-CM | POA: Diagnosis not present

## 2022-07-17 DIAGNOSIS — F0280 Dementia in other diseases classified elsewhere without behavioral disturbance: Secondary | ICD-10-CM | POA: Diagnosis not present

## 2022-07-17 DIAGNOSIS — G309 Alzheimer's disease, unspecified: Secondary | ICD-10-CM | POA: Diagnosis not present

## 2022-07-17 DIAGNOSIS — E46 Unspecified protein-calorie malnutrition: Secondary | ICD-10-CM | POA: Diagnosis not present

## 2022-07-18 DIAGNOSIS — R1312 Dysphagia, oropharyngeal phase: Secondary | ICD-10-CM | POA: Diagnosis not present

## 2022-07-19 DIAGNOSIS — R1312 Dysphagia, oropharyngeal phase: Secondary | ICD-10-CM | POA: Diagnosis not present

## 2023-03-09 IMAGING — CT CT HEAD W/O CM
3 series · 16 of 47 positions shown, 19 images · non-contrast
Comparison: None.

CLINICAL DATA: Altered mental status.

EXAM:
CT HEAD WITHOUT CONTRAST
TECHNIQUE: Contiguous axial images were obtained from the base of the skull
through the vertex without intravenous contrast.

[Series 2: head wo · axial · 0.42mm/px · z∈[+607,+737]mm · 10 of 32 slices shown, 13 images]
[im 3/32  brain]
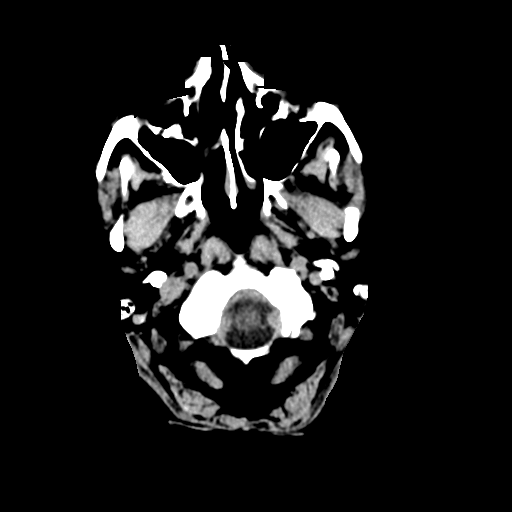
[im 3/32  bone]
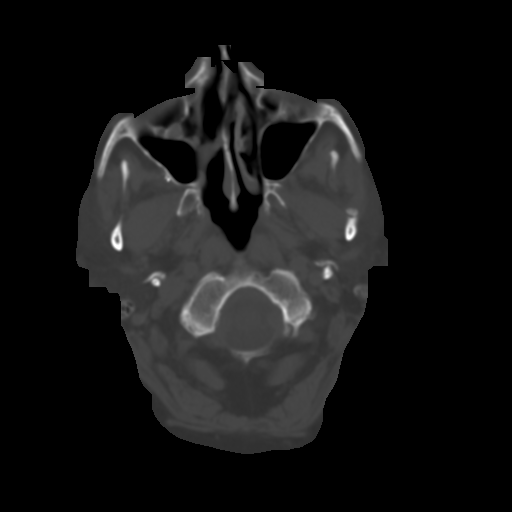
[im 6/32  brain]
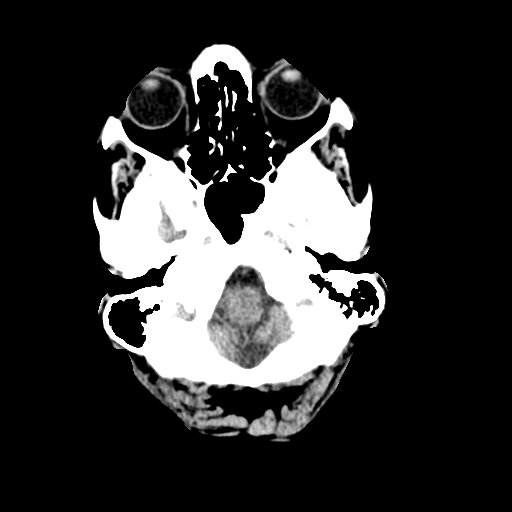
[im 9/32  brain]
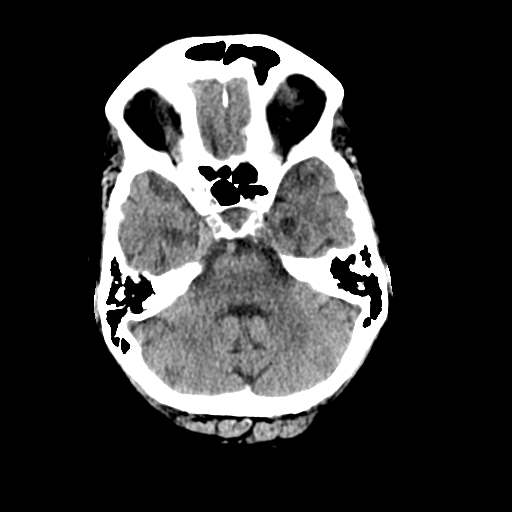
[im 11/32  brain]
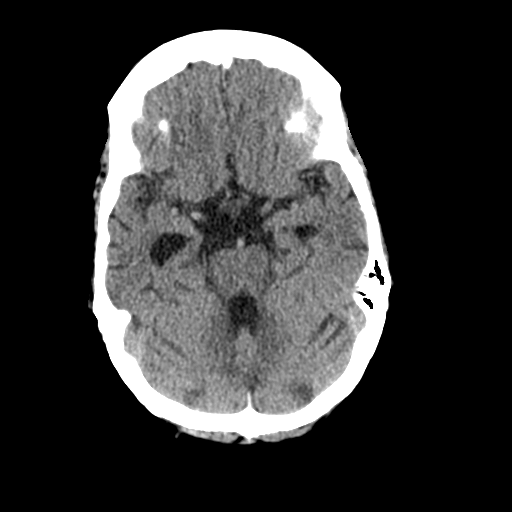
[im 14/32  brain]
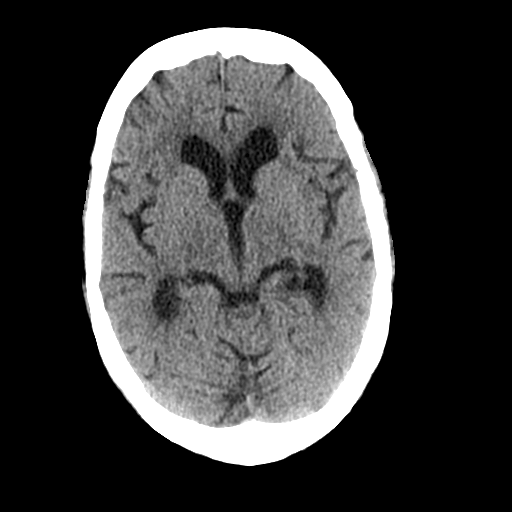
[im 14/32  bone]
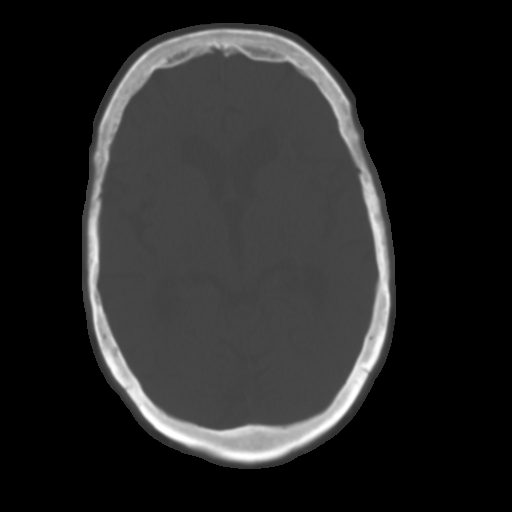
[im 18/32  brain]
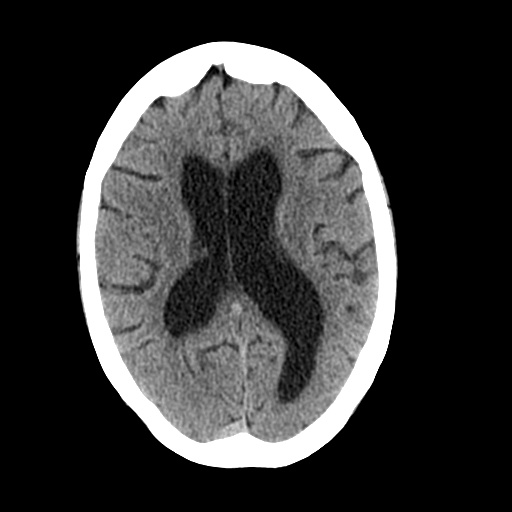
[im 21/32  brain]
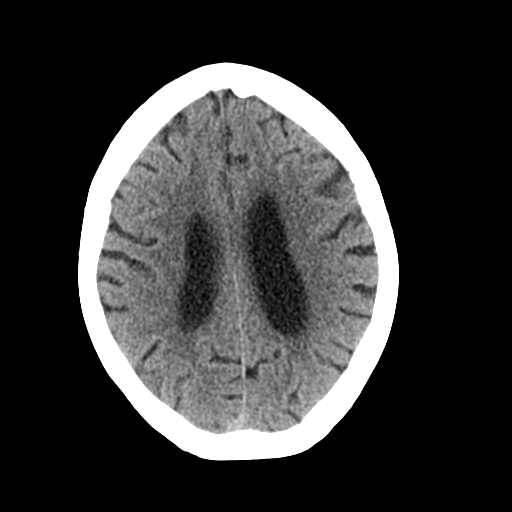
[im 24/32  brain]
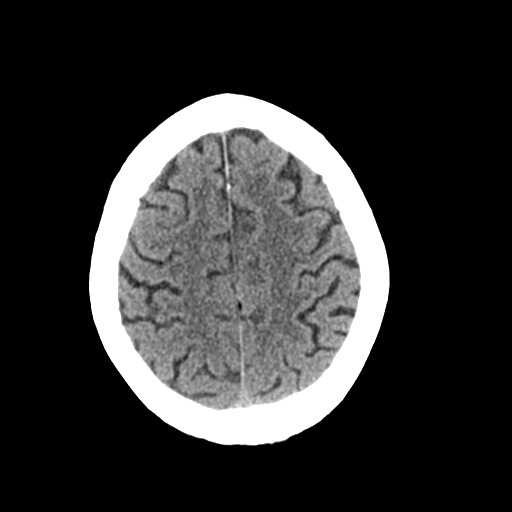
[im 26/32  brain]
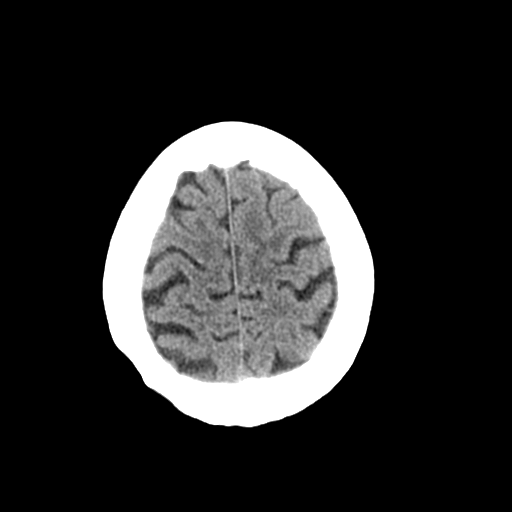
[im 26/32  bone]
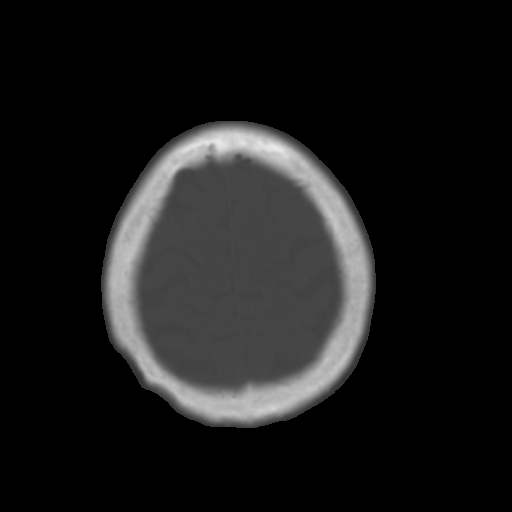
[im 29/32  brain]
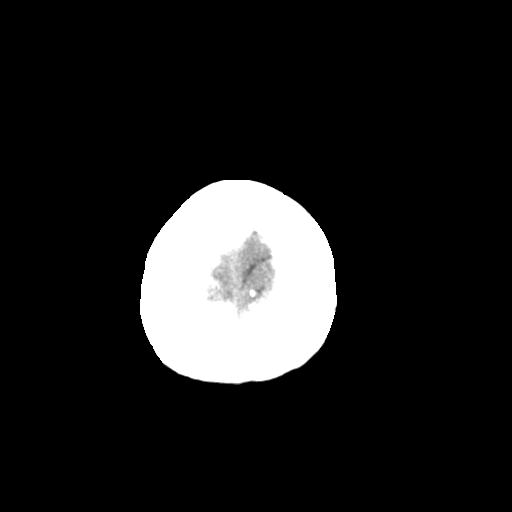

[Series 4: coronal soft tissue · coronal · 0.33mm/px · 3 of 69 slices shown]
[im 23/69  brain]
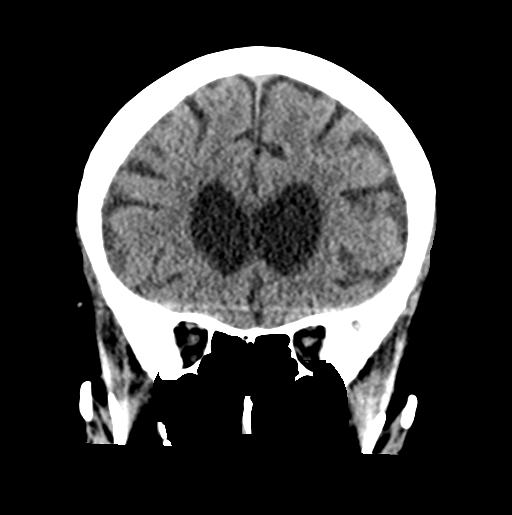
[im 31/69  brain]
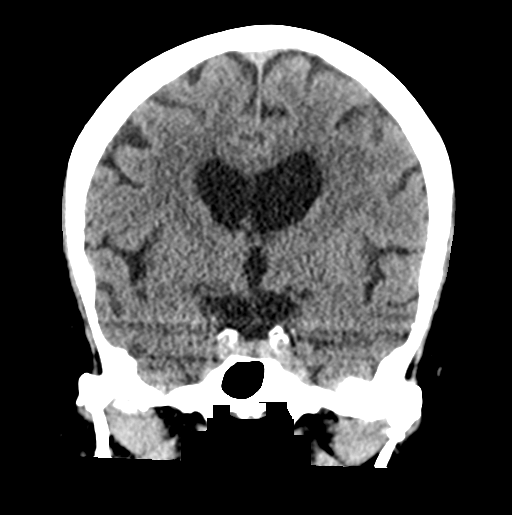
[im 38/69  brain]
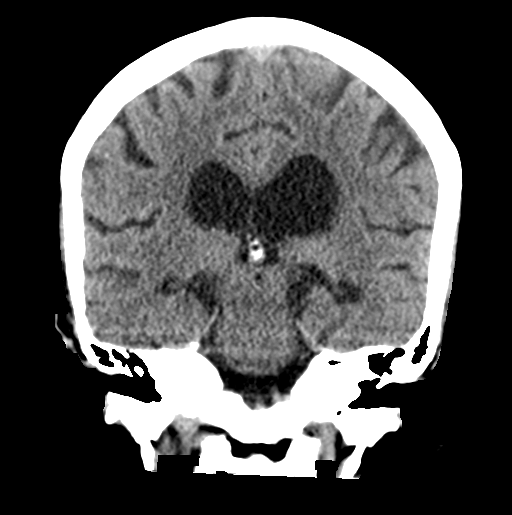

[Series 5: sagittal soft tissue · sagittal · 0.35mm/px · 3 of 53 slices shown]
[im 18/53  brain]
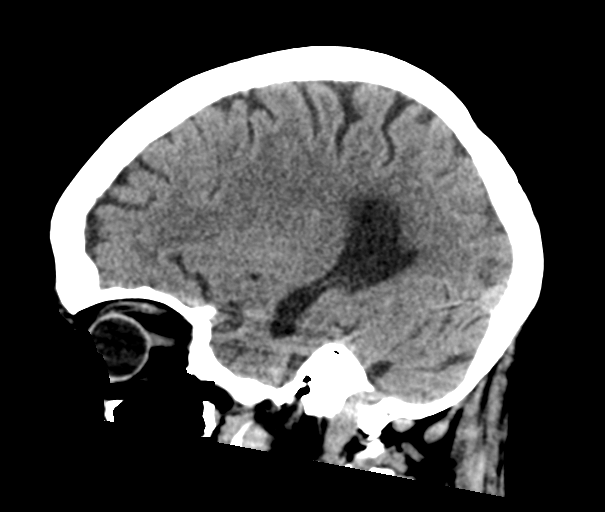
[im 27/53  brain]
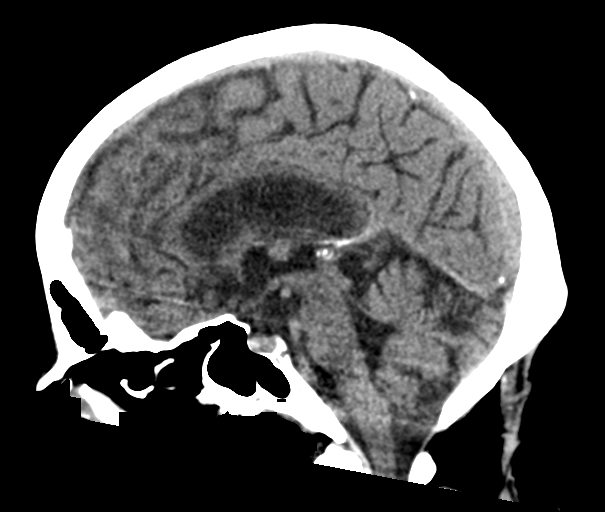
[im 35/53  brain]
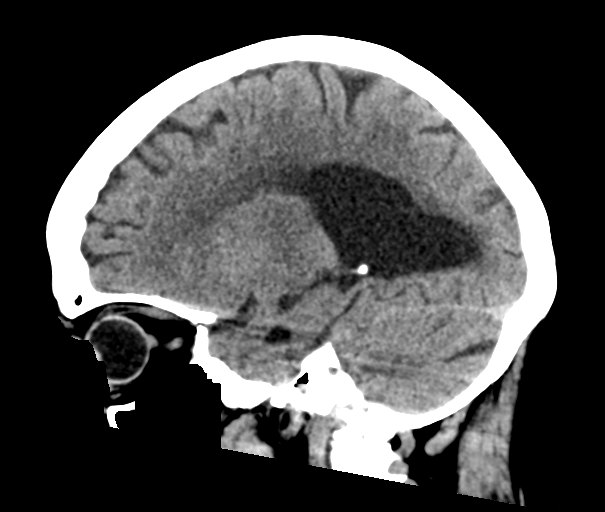

[16 of 47 positions shown; findings below may reference images not displayed]

FINDINGS: Brain: There is mild cerebral atrophy with widening of the
extra-axial spaces and ventricular dilatation.
There are areas of decreased attenuation within the white matter
tracts of the supratentorial brain, consistent with microvascular
disease changes.

A small chronic right basal ganglia lacunar infarct is noted.

Vascular: No hyperdense vessel or unexpected calcification.

Skull: Normal. Negative for fracture or focal lesion.

Sinuses/Orbits: No acute finding.

Other: None.
IMPRESSION: 1. Generalized cerebral atrophy.
2. No acute intracranial abnormality.
